# Patient Record
Sex: Male | Born: 1970 | Race: Black or African American | Hispanic: No | Marital: Married | Smoking: Never smoker
Health system: Southern US, Community
[De-identification: ages and names within clinical notes are randomized; demographics above are authoritative.]

## PROBLEM LIST (undated history)

## (undated) DIAGNOSIS — R7989 Other specified abnormal findings of blood chemistry: Secondary | ICD-10-CM

## (undated) DIAGNOSIS — Z9889 Other specified postprocedural states: Secondary | ICD-10-CM

## (undated) DIAGNOSIS — E7800 Pure hypercholesterolemia, unspecified: Secondary | ICD-10-CM

## (undated) DIAGNOSIS — G4700 Insomnia, unspecified: Secondary | ICD-10-CM

## (undated) DIAGNOSIS — F419 Anxiety disorder, unspecified: Secondary | ICD-10-CM

## (undated) DIAGNOSIS — E291 Testicular hypofunction: Secondary | ICD-10-CM

## (undated) DIAGNOSIS — G43909 Migraine, unspecified, not intractable, without status migrainosus: Secondary | ICD-10-CM

## (undated) DIAGNOSIS — M5020 Other cervical disc displacement, unspecified cervical region: Secondary | ICD-10-CM

## (undated) DIAGNOSIS — M5010 Cervical disc disorder with radiculopathy, unspecified cervical region: Secondary | ICD-10-CM

## (undated) DIAGNOSIS — R748 Abnormal levels of other serum enzymes: Secondary | ICD-10-CM

## (undated) DIAGNOSIS — I10 Essential (primary) hypertension: Secondary | ICD-10-CM

## (undated) DIAGNOSIS — Z8739 Personal history of other diseases of the musculoskeletal system and connective tissue: Secondary | ICD-10-CM

## (undated) HISTORY — DX: Other specified postprocedural states: Z98.890

## (undated) HISTORY — PX: SEPTOPLASTY: SUR1290

## (undated) HISTORY — DX: Other specified abnormal findings of blood chemistry: R79.89

## (undated) HISTORY — PX: BACK SURGERY: SHX140

## (undated) HISTORY — DX: Personal history of other diseases of the musculoskeletal system and connective tissue: Z87.39

## (undated) HISTORY — DX: Insomnia, unspecified: G47.00

## (undated) HISTORY — DX: Testicular hypofunction: E29.1

## (undated) HISTORY — DX: Other cervical disc displacement, unspecified cervical region: M50.20

## (undated) HISTORY — DX: Migraine, unspecified, not intractable, without status migrainosus: G43.909

## (undated) HISTORY — DX: Pure hypercholesterolemia, unspecified: E78.00

---

## 2000-12-11 ENCOUNTER — Emergency Department (HOSPITAL_COMMUNITY): Admission: EM | Admit: 2000-12-11 | Discharge: 2000-12-12 | Payer: Self-pay | Admitting: Emergency Medicine

## 2002-08-21 ENCOUNTER — Ambulatory Visit (HOSPITAL_COMMUNITY): Admission: RE | Admit: 2002-08-21 | Discharge: 2002-08-21 | Payer: Self-pay | Admitting: *Deleted

## 2002-08-21 ENCOUNTER — Encounter: Payer: Self-pay | Admitting: *Deleted

## 2002-08-29 ENCOUNTER — Encounter: Admission: RE | Admit: 2002-08-29 | Discharge: 2002-10-03 | Payer: Self-pay | Admitting: *Deleted

## 2002-11-22 ENCOUNTER — Ambulatory Visit (HOSPITAL_COMMUNITY): Admission: RE | Admit: 2002-11-22 | Discharge: 2002-11-22 | Payer: Self-pay | Admitting: *Deleted

## 2002-11-22 ENCOUNTER — Encounter: Payer: Self-pay | Admitting: *Deleted

## 2005-05-05 ENCOUNTER — Ambulatory Visit (HOSPITAL_COMMUNITY): Admission: RE | Admit: 2005-05-05 | Discharge: 2005-05-05 | Payer: Self-pay | Admitting: Family Medicine

## 2006-11-19 ENCOUNTER — Encounter
Admission: RE | Admit: 2006-11-19 | Discharge: 2006-11-19 | Payer: Self-pay | Admitting: Physical Medicine and Rehabilitation

## 2007-07-22 ENCOUNTER — Ambulatory Visit: Payer: Self-pay | Admitting: Pulmonary Disease

## 2007-08-12 ENCOUNTER — Ambulatory Visit: Payer: Self-pay | Admitting: Pulmonary Disease

## 2007-10-06 HISTORY — PX: LAMINECTOMY: SHX219

## 2007-10-06 HISTORY — PX: LUMBAR DISC SURGERY: SHX700

## 2007-12-19 ENCOUNTER — Ambulatory Visit (HOSPITAL_COMMUNITY): Admission: RE | Admit: 2007-12-19 | Discharge: 2007-12-20 | Payer: Self-pay | Admitting: Neurosurgery

## 2008-01-18 ENCOUNTER — Encounter: Admission: RE | Admit: 2008-01-18 | Discharge: 2008-01-18 | Payer: Self-pay | Admitting: Neurosurgery

## 2008-03-07 ENCOUNTER — Telehealth (INDEPENDENT_AMBULATORY_CARE_PROVIDER_SITE_OTHER): Payer: Self-pay | Admitting: *Deleted

## 2008-10-26 ENCOUNTER — Encounter: Admission: RE | Admit: 2008-10-26 | Discharge: 2008-10-26 | Payer: Self-pay | Admitting: Family Medicine

## 2011-02-17 NOTE — Assessment & Plan Note (Signed)
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  Since the last visit he has been working on various things  that we discussed and feels that his sleep is much improved.  He thinks  the trazodone has really helped in terms of getting him to sleep and he  is staying asleep through the night and feels more rested in the  mornings.  He did have to do the stimulus control therapy for about 3-4  days before he started getting into a normal sleep cycle.  The patient  has tried to stop the trazodone and does not sleep as well and has a  little bit more difficulties with sleep onset.  He has continued to do  the ritualistic behaviors in order to help him initiate sleep.   PHYSICAL EXAMINATION:  GENERAL:  He is a well-developed black male in no  distress.  Blood pressure is 126/82, pulse is 78, temperature is 98,  weight is 231 pounds, O2 saturation on room air is 99%.   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  The patient has been working on ritualistic behaviors at  night, his sleep hygiene, as well as modified stimulus control/sleep  restrictive therapy.  He has benefited greatly from trazodone.  I have  asked him to continue with this and to work on gradually weaning himself  off of the trazodone.  At least this medication does not have an  addictive effect or build tolerance.  I would like for him to try to  wean himself off the trazodone gradually over time.  He has clearly made  significant strides in just a very short period of time considering the  longstanding nature of his problem.  He is a reasonable person, and I  suspect that he will wean himself from this medication if he  sticks with  various things that we have discussed.   PLAN:  1. Continue with ritualistic behaviors, sleep hygiene issues, as well      as stimulus control therapy until he is able to sleep spontaneously      without trazodone.  I have asked him over the next few weeks to try      and decrease the dose to 25 mg nightly and then after a few more      weeks try and wean himself completely off.  Patient is agreeable to      this.  2. The patient will follow up on a p.r.n. basis.     Barbaraann Share, MD,FCCP  Electronically Signed    [facility]  DD: 08/12/2007  DT: 08/12/2007  Job #: [id number]   cc:   Tally Joe, M.D.

## 2011-02-17 NOTE — Assessment & Plan Note (Signed)
[facility]                             PULMONARY OFFICE NOTE   [name], [name]                      MRN:          [id number]  DATE:07/22/2007                            DOB:          1971/02/25    HISTORY OF PRESENT ILLNESS:  The patient is a 40 year old black male who  I have been asked to see for chronic insomnia.  Patient states that he  has difficulties over the last 10 years and that he is only getting  about 3-4 hours of sleep a night.  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  The patient typically tries to go to bed between  10 and 11 at night but unfortunately watches TV and reads in bed.  He  has the TV on a timer and the lights will typically go out at 12  midnight.  It takes him anywhere from 1-2 hours to get to sleep.  Patient will usually awaken at least 2-3 times a night and typically  2:30 is a very common time for him.  He will go downstairs and usually  get something to drink and will watch TV and may also eat as well.  He  will typically go back to sleep around 5:30 but can also easily fall  asleep on the sofa.  He starts his day at 7 a.m. and he is never rested  upon arising.  The patient does have a sense of frustration and going to  sleep is worse in hotels than at home.  He worries significantly about  whether he can function adequately at his demanding job the next day.  Patient does not nap during the day and drinks one cup of caffeinated  tea in the morning only, he does not take in any other caffeine.  He  will typically exercise between 5 and 6 pm for about 1 to 1-1/2 hours.  Patient has been tried on Ambien in the past and it resulted in abnormal  behaviors.  The patient has had a sleep study done on April 22, 2007  where he was found to have an apnea-hypopnea index of only 4 events per  hour which is not significantly  abnormal.   PAST MEDICAL HISTORY:  1. Dyslipidemia.  2. History of chronic headaches.   CURRENT MEDICATIONS:  1. Testim 1% daily.  2. Niaspan 500 mg daily.  3. Tylenol PM p.r.n.   PATIENT HAS NO KNOWN DRUG ALLERGIES.   SOCIAL HISTORY:  He is married and has children.  He has never smoked.   FAMILY HISTORY:  Remarkable for mother having high blood pressure and  diabetes.   REVIEW OF SYSTEMS:  As per history of present illness, also see patient  intake form documented in the chart.   PHYSICAL EXAMINATION:  GENERAL:  He is a well-developed male in no acute  distress.  Blood pressure 142/98, pulse 77, temperature 98, weight is  231 pounds, O2 saturation on room air 97%.  HEENT:  Pupils equal, round,  reactive to light and accommodation;  extraocular muscles are intact.  Nares are patent without discharge,  oropharynx is clear.  NECK:  Supple without JVD or lymphadenopathy, there is no palpable  thyromegaly.  CHEST:  Totally clear.  CARDIAC:  Reveals regular rate and rhythm with no murmurs, rubs or  gallops.  ABDOMEN:  Soft, nontender with good bowel sounds.  Genital exam, rectal exam and breast exam was not done and not  indicated.  LOWER EXTREMITIES:  Without edema, pulses are intact distally.  NEUROLOGICALLY:  Alert and oriented with no obvious motor deficits.   IMPRESSION:  Probable psychophysiologic insomnia.  The patient's history  is most consistent with this.  The patient has a very demanding job and  worries at night whether he can get enough sleep to function the next  day.  He clearly has a sense of frustration about going to sleep.  There  were also some issues with sleep hygiene that have developed since he  has been having his problem.   PLAN:  1. I have asked the patient to develop some type of ritualistic      behavior that he can do at bedtime that will enable him to turn      off his brain..  2. I have recommended stimulus control therapy and have gone  into      great detail with him about this.  This will include not staying in      bed if he can not get to sleep within 30 minutes and to come out to      the family room and watch TV or read under low lights until he can      get sleepy and go back to sleep.  I have asked him not to sleep on      the couch under any circumstances, and also not to eat or drink      anything during the middle of the night.  I have also asked him to      stay away from the computer after 11 p.m.  I have also described      for him sleep restriction therapy where he can go to bed much later      than usual so that his time of sleep more closely matches his time      in bed.  This helps with the ongoing frustration.  3. I have talked with the patient about sleep hygiene issues including      not watching any TV or reading in bed while he has this issue.  4. Will give the patient a trial of trazodone 50 mg nightly along with      Rozerem 8 mg nightly for the next 2 weeks.  He can then stay on the      trazodone until he can work through this further.  5. If the patient continues to have difficulties, he may need      cognitive behavioral therapy from psychiatry to help him get      through this.  6. The patient will follow up in 4 weeks, sooner if there is problems.     Barbaraann Share, MD,FCCP  Electronically Signed    [facility]  DD: 08/12/2007  DT: 08/12/2007  Job #: [id number]   cc:   Tally Joe, M.D.

## 2011-02-17 NOTE — Op Note (Signed)
[name], [name]               ACCOUNT NO.:  [id number]   MEDICAL RECORD NO.:  [id number]          PATIENT TYPE:  AMB   LOCATION:  SDS                          FACILITY:  [facility]   PHYSICIAN:  Donalee Citrin, M.D.        DATE OF BIRTH:  [birth year]   DATE OF PROCEDURE:  12/19/2007  DATE OF DISCHARGE:                               OPERATIVE REPORT   PREOPERATIVE DIAGNOSIS:  Right-sided L5-S1 radiculopathy from lumbar  spondylosis with stenosis L5-S1 right.   PROCEDURE:  Decompressive laminectomy L5-S4 and microdissection of the  right L5 and S1 nerve roots and microscopic diskectomy.   SURGEON:  Donalee Citrin, M.D.   ASSISTANT:  Reinaldo Meeker, M.D.   ANESTHESIA:  General endotracheal.   HISTORY OF PRESENT ILLNESS:  The patient is a very pleasant 40 year old  gentleman who has had back and predominantly right buttock and leg pain  radiating down to the posterior thigh and occasionally the top of his  foot and now is foot is bothering him with numbness and tingling in the  same distribution.  The patient was refractory to all forms of  conservative treatment.  MRI scan showed severe degenerative collapse  with spondylosis compressing the right L5 and S1 nerve root in the  lateral recess.  The patient because of failure of conservative  treatment, MRI findings, physical examination, the patient was  recommended laminectomy microdiskectomy and disk decompression with  foraminotomies of the L5 and S1 nerve roots.  The risks and benefits of  the operation were explained to the patient who understood and agreed to  proceed.   PROCEDURE IN DETAIL:  The patient was brought to the OR where he  received general anesthesia and placed prone on Wilson frame.  The back  was prepped in routine fashion.  Preoperative x-ray localized the L5-S1  disk space and after infiltrating with 10 mL of lidocaine with  epinephrine a midline incision was made.  Bovie electrocautery was used  to take down and  subperiosteal dissection carried out the lamina of L5-  S1 and the inferior aspect of L4.  Intraoperative x-ray confirmed  localization of the L5-S1 disk space then the entire L5 lamina on the  right side was removed and the undersurface of the medial facet complex  was under bitten.  The L5 pedicle was identified.  The S1 pedicle was  identified.  The superior aspect of the S1 lamina was removed.  This  exposed the thecal sac and ligamentum flavum which was also removed in  piecemeal fashion exposing the proximal S1 nerve root.  Then marching  laterally and superiorly the proximal L5 nerve root was identified flush  with the pedicle and this foraminotomy was under bitten.  There was  marked spondylosis and degenerative collapse and the L5 nerve root was  overlying a spondylitic ridge at the disk space which was bitten away  with an osteophyte remover after coagulation in the epidural veins.  Then further biting away of the lateral osteophyte at this level also  decompressed the proximal S1 nerve root.  Both foramina were opened  up  and unroofed and noted to be fairly stenotic due to the facet  arthropathy and spondylitic collapse.  By the end of the foraminotomies  each foramen easily accepted a hockeystick and coronary dilator and  noted to be widely patent.  The wound was then copiously irrigated and  meticulous hemostasis was maintained.  Gelfoam was laid on top of the  dura.  Muscle fascia was reapproximated in  layers with interrupted Vicryl and the skin was closed with a running 4-  0 subcuticular.  Benzoin and Steri-Strips applied.  The patient went to  recovery room in stable condition.  At the end of the case sponge and  needle counts were correct.           ______________________________  Donalee Citrin, M.D.     GC/MEDQ  D:  12/19/2007  T:  12/19/2007  Job:  [id number]

## 2011-06-29 LAB — CBC
HCT: 48.1
Hemoglobin: 16.1
MCHC: 33.5
RDW: 11.8

## 2011-10-03 ENCOUNTER — Ambulatory Visit (INDEPENDENT_AMBULATORY_CARE_PROVIDER_SITE_OTHER): Payer: 59

## 2011-10-03 DIAGNOSIS — J019 Acute sinusitis, unspecified: Secondary | ICD-10-CM

## 2012-07-06 ENCOUNTER — Other Ambulatory Visit: Payer: Self-pay | Admitting: Neurosurgery

## 2012-07-06 DIAGNOSIS — M545 Low back pain, unspecified: Secondary | ICD-10-CM

## 2012-08-21 ENCOUNTER — Encounter (HOSPITAL_COMMUNITY): Payer: Self-pay | Admitting: *Deleted

## 2012-08-21 ENCOUNTER — Emergency Department (HOSPITAL_COMMUNITY)
Admission: EM | Admit: 2012-08-21 | Discharge: 2012-08-22 | Disposition: A | Discharge status: Home / Self Care | Payer: 59 | Attending: Emergency Medicine | Admitting: Emergency Medicine

## 2012-08-21 DIAGNOSIS — E7800 Pure hypercholesterolemia, unspecified: Secondary | ICD-10-CM | POA: Insufficient documentation

## 2012-08-21 DIAGNOSIS — I10 Essential (primary) hypertension: Secondary | ICD-10-CM | POA: Insufficient documentation

## 2012-08-21 DIAGNOSIS — R002 Palpitations: Secondary | ICD-10-CM | POA: Insufficient documentation

## 2012-08-21 DIAGNOSIS — R21 Rash and other nonspecific skin eruption: Secondary | ICD-10-CM | POA: Insufficient documentation

## 2012-08-21 DIAGNOSIS — R55 Syncope and collapse: Secondary | ICD-10-CM | POA: Insufficient documentation

## 2012-08-21 DIAGNOSIS — Z79899 Other long term (current) drug therapy: Secondary | ICD-10-CM | POA: Insufficient documentation

## 2012-08-21 HISTORY — DX: Pure hypercholesterolemia, unspecified: E78.00

## 2012-08-21 HISTORY — DX: Essential (primary) hypertension: I10

## 2012-08-21 LAB — BASIC METABOLIC PANEL
BUN: 12 mg/dL (ref 6–23)
Calcium: 9.3 mg/dL (ref 8.4–10.5)
Creatinine, Ser: 1.33 mg/dL (ref 0.50–1.35)
GFR calc non Af Amer: 64 mL/min — ABNORMAL LOW (ref >90)
Glucose, Bld: 129 mg/dL — ABNORMAL HIGH (ref 70–99)
Sodium: 137 mEq/L (ref 135–145)

## 2012-08-21 LAB — URINALYSIS, ROUTINE W REFLEX MICROSCOPIC
Bilirubin Urine: NEGATIVE
Glucose, UA: 100 mg/dL — AB
Hgb urine dipstick: NEGATIVE
Ketones, ur: NEGATIVE mg/dL
Protein, ur: NEGATIVE mg/dL

## 2012-08-21 LAB — CBC
Hemoglobin: 16.3 g/dL (ref 13.0–17.0)
MCH: 30.1 pg (ref 26.0–34.0)
MCHC: 33.3 g/dL (ref 30.0–36.0)
MCV: 90.2 fL (ref 78.0–100.0)

## 2012-08-21 MED ORDER — SODIUM CHLORIDE 0.9 % IV BOLUS (SEPSIS)
1000.0000 mL | Freq: Once | INTRAVENOUS | Status: AC
  Administered 2012-08-21: 1000 mL via INTRAVENOUS

## 2012-08-21 MED ORDER — POTASSIUM CHLORIDE CRYS ER 20 MEQ PO TBCR
40.0000 meq | EXTENDED_RELEASE_TABLET | Freq: Once | ORAL | Status: AC
  Administered 2012-08-22: 40 meq via ORAL
  Filled 2012-08-21: qty 2

## 2012-08-21 NOTE — ED Provider Notes (Signed)
History     CSN: [id number]  Arrival date & time 08/21/12  2203   First MD Initiated Contact with Patient 08/21/12 2256      Chief Complaint  Patient presents with  . Dizziness  . Near Syncope    (Consider location/radiation/quality/duration/timing/severity/associated sxs/prior treatment) HPI Comments: Pateint has 3 episodes of dizziness and rapid heart rate lasting about 10 minutes each  Has had this in the past but with exercise at the gym.  Denies caffeine consumption, new medications, drug use, recent illness    The history is provided by the patient.    Past Medical History  Diagnosis Date  . Hypercholesteremia   . Hypertension     Past Surgical History  Procedure Date  . Back surgery     diskectomy    History reviewed. No pertinent family history.  History  Substance Use Topics  . Smoking status: Not on file  . Smokeless tobacco: Not on file  . Alcohol Use: Yes     Comment: occ      Review of Systems  Constitutional: Negative for fever and chills.  HENT: Negative for congestion and rhinorrhea.   Eyes: Negative for visual disturbance.  Respiratory: Negative for shortness of breath.   Cardiovascular: Positive for palpitations. Negative for chest pain.  Gastrointestinal: Negative for nausea, vomiting and diarrhea.  Genitourinary: Negative for dysuria and frequency.  Musculoskeletal: Negative for myalgias.  Skin: Negative for rash and wound.  Neurological: Positive for light-headedness. Negative for weakness and numbness.    Allergies  Review of patient's allergies indicates no known allergies.  Home Medications   Current Outpatient Rx  Name  Route  Sig  Dispense  Refill  . LOSARTAN POTASSIUM 50 MG PO TABS   Oral   Take 50 mg by mouth every morning.         Marland Kitchen MELOXICAM 7.5 MG PO TABS   Oral   Take 7.5 mg by mouth daily as needed. For pain         . QUETIAPINE FUMARATE 100 MG PO TABS   Oral   Take 100 mg by mouth at bedtime.         Marland Kitchen ROSUVASTATIN CALCIUM 10 MG PO TABS   Oral   Take 10 mg by mouth every other day.         . TESTOSTERONE CYPIONATE 100 MG/ML IM OIL   Intramuscular   Inject 100 mg into the muscle every 14 (fourteen) days. For IM use only           BP 152/97  Pulse 94  Temp 98.1 F (36.7 C) (Oral)  Resp 20  Ht 5\' 9"  (1.753 m)  Wt 225 lb (102.059 kg)  BMI 33.23 kg/m2  SpO2 96%  Physical Exam  Constitutional: He appears well-developed and well-nourished.  HENT:  Head: Normocephalic.  Eyes: Pupils are equal, round, and reactive to light.  Neck: Normal range of motion.  Cardiovascular: Regular rhythm.  Tachycardia present.   Pulmonary/Chest: Effort normal and breath sounds normal. He exhibits no tenderness.  Abdominal: He exhibits no distension. There is no tenderness.  Musculoskeletal: Normal range of motion. He exhibits no edema and no tenderness.  Skin: No rash noted. No erythema.    ED Course  Procedures (including critical care time)  Labs Reviewed  BASIC METABOLIC PANEL - Abnormal; Notable for the following:    Potassium 3.3 (*)     Glucose, Bld 129 (*)     GFR calc non Af Amer 64 (*)  GFR calc Af Amer 74 (*)     All other components within normal limits  URINALYSIS, ROUTINE W REFLEX MICROSCOPIC - Abnormal; Notable for the following:    Color, Urine STRAW (*)     Glucose, UA 100 (*)     All other components within normal limits  GLUCOSE, CAPILLARY - Abnormal; Notable for the following:    Glucose-Capillary 135 (*)     All other components within normal limits  CBC  TROPONIN I   No results found.   1. Palpitations     ED ECG REPORT   Date: 08/22/2012  EKG Time: 1:19 AM  Rate:100  Rhythm: sinus tachycardia,  there are no previous tracings available for comparison  Axis: normal  Intervals:none  ST&T Change: none  Narrative Interpretation: ST otherwise normal             MDM   No abnormalities found tonight patient encouraged to keep a diary of  symptoms looking for a pattern to emerge       Arman Filter, NP 08/22/12 0119  Arman Filter, NP 08/22/12 [phone]

## 2012-08-21 NOTE — ED Notes (Addendum)
EKG given to EDP, Rancour, MD.

## 2012-08-21 NOTE — ED Notes (Signed)
Pt reports sudden onset of dizziness, rapid heart beat and feeling faint while at home. Reports taking night time meds at 830pm-100mg  of seroquel. States at 900pm symptoms started. Pt reports "I work out a lot and this has happened twice in the past month while working out. States I just put my head between my legs and it gets better."

## 2012-08-22 NOTE — ED Provider Notes (Signed)
Medical screening examination/treatment/procedure(s) were performed by non-physician practitioner and as supervising physician I was immediately available for consultation/collaboration.   David H Yao, MD 08/22/12 0429 

## 2012-10-20 ENCOUNTER — Other Ambulatory Visit: Payer: Self-pay | Admitting: Otolaryngology

## 2012-10-20 DIAGNOSIS — R0981 Nasal congestion: Secondary | ICD-10-CM

## 2012-10-20 DIAGNOSIS — J329 Chronic sinusitis, unspecified: Secondary | ICD-10-CM

## 2012-11-07 ENCOUNTER — Ambulatory Visit
Admission: RE | Admit: 2012-11-07 | Discharge: 2012-11-07 | Disposition: A | Discharge status: Home / Self Care | Payer: 59 | Source: Ambulatory Visit | Attending: Otolaryngology | Admitting: Otolaryngology

## 2012-11-07 DIAGNOSIS — R0981 Nasal congestion: Secondary | ICD-10-CM

## 2012-11-07 DIAGNOSIS — J329 Chronic sinusitis, unspecified: Secondary | ICD-10-CM

## 2013-01-13 ENCOUNTER — Ambulatory Visit (INDEPENDENT_AMBULATORY_CARE_PROVIDER_SITE_OTHER): Payer: 59 | Admitting: Neurology

## 2013-01-13 ENCOUNTER — Ambulatory Visit: Payer: Self-pay | Admitting: Neurology

## 2013-01-13 ENCOUNTER — Encounter: Payer: Self-pay | Admitting: Neurology

## 2013-01-13 VITALS — BP 135/91 | HR 53 | Temp 98.1°F | Ht 70.0 in | Wt 236.0 lb

## 2013-01-13 DIAGNOSIS — F410 Panic disorder [episodic paroxysmal anxiety] without agoraphobia: Secondary | ICD-10-CM | POA: Insufficient documentation

## 2013-01-13 DIAGNOSIS — G4700 Insomnia, unspecified: Secondary | ICD-10-CM | POA: Insufficient documentation

## 2013-01-13 MED ORDER — QUETIAPINE FUMARATE 100 MG PO TABS: 100.0000 mg | ORAL_TABLET | Freq: Every day | ORAL | Status: DC

## 2013-01-13 NOTE — Patient Instructions (Signed)
Insomnia  Insomnia means you have trouble falling or staying asleep. It affects about one person in three at different times and is usually related to stress from work, school, or personal relations. Insomnia is also a sign of depression or anxiety. Other medical problems that cause insomnia include conditions that cause pain, night leg cramps, coughing, shortness of breath, urinary problems, and fevers. Sleep apnea is an abnormal breathing pattern at night that can cause insomnia and loud snoring. Certain medications and excess intake of caffeine drinks (coffee, tea, colas) can also interfere with normal sleep.  Treatment for insomnia depends on the cause. Besides specific medical treatment, the following measures can help you relax and get better sleep. Get regular exercise every day, at least several hours before bed time. Try to get to bed at the same time every night. Take a hot bath before retiring to help you relax. Do not stay in bed if you are unable to sleep. During the daytime avoid staying in bed to watch television, eat, or read. Reduce unwanted noise and light in your room. Keep your room at a comfortable temperature.  Avoid alcohol as it causes one to sleep less soundly, may cause you to awaken during the night, and can leave you feeling groggy the next day. Using a mild sedative prescribed or suggested by your caregiver may be needed, but the daily use of sleeping pills is not recommended. Anti-depressant medicines can improve sleep in people with depression. Please call your doctor for follow up care to better understand the cause and proper treatment of your insomnia.  Document Released: 10/29/2004 Document Revised: 12/14/2011 Document Reviewed: 09/21/2005  [employer] Patient Information 2013 [employer]

## 2013-01-13 NOTE — Progress Notes (Signed)
[facility]  Provider:  Dr Vickey Huger Referring Provider: No ref. provider found Primary Care Physician:  Nathan Sauers, RN  Chief Complaint  Patient presents with  . Follow-up    insomnia    HPI:  [name] is a 42 y.o. male here originally  as a referral from [employer] [facility].  The patient suffers from chronic insomnia and had been followed by Dr. Theressa Love before. He had tried Ambien, clonazepam, Effexor were with old achieving more sleep time or a shortened sleep latency. The patient was hypersomnolent in daytime and very fatigued before he was treated for low testosterone levels, since then he has become more insomniac but is less daytime  . The patient has a regular work schedule between 8 and 5 PM and has to drive a lot.  He has continued trouble initiating and maintaining sleep often he wakes up earlier around 1 or 2 AM and that she is on before hours of sleep in total. [name]  at [facility] tried sleep restriction and prescribed Ambien and trazodone,  which worked for several weeks.  He then tried Klonopin and this only worked for  3-4 nights it also gave him a hangover.  The patient started in spring 2013 on Seroquel  And he initially was happy to report that he fell asleep and sustain sleep for 6-7 hours, while  in the daytime he felt refreshed and energized.  He reported no cognitive impairment and his Epworth was at 2 points his fatigue severity scale at 12 points at that time he required however at increasingly higher doses and 150 mg of Seroquel . By  July of 2013 he developed again fragmented sleep combine to Seroquel with Lunesta.  Epworth went up to 15 points and severe fatigue severity scale to a 42 points.  In November of last year he again became mainly and some neck he also had severe migraines and at least 2 panic attacks within the months of November he was evaluated in a local emergency room with a racing heart but had  normal EKG and enzymes him at that time we discussed a referral to a psychologist for insomnia and for possible PTE as the components or panic disorder components. Today's a revisit and I plan to refill her sleep aids but also wanted to followup on his referral.  The patient had just undergone a septoplasty and the latest visit with a psychologist for a month until he is healed. Panic attacks now occur 3-4 of days out of the week , often while relaxed - heart starts pounding and he becomes short of breath.   Review of Systems: Out of a complete 14 system review, the patient complains of only the following symptoms, and all other reviewed systems are negative. EDS. fatigue .  History   Social History  . Marital Status: Married    Spouse Name: N/A    Number of Children: N/A  . Years of Education: N/A   Occupational History  . [employer]     has an associate degree   Social History Main Topics  . Smoking status: Never Smoker   . Smokeless tobacco: Not on file  . Alcohol Use: Yes     Comment: occasionally, one glass of wine monthly  . Drug Use: No  . Sexually Active: Not on file   Other Topics Concern  . Not on file   Social History Narrative  . No narrative on file  Family History  Problem Relation Age of Onset  . Diabetes Mother   . Hypertension Mother   . Stroke Mother   . Diabetes Father   . Migraines Sister     3 sisters    Past Medical History  Diagnosis Date  . Hypercholesteremia   . Hypertension   . Insomnia     PTSD  . H/O degenerative disc disease   . Hypogonadism, male   . Herniated disc, cervical     L4,L5  . H/O laminectomy     lumbar, 2009  . Low testosterone   . Migraine     once monthly  . High cholesterol     Past Surgical History  Procedure Laterality Date  . Back surgery      diskectomy  . Laminectomy  2009  . Lumbar disc surgery  2009    Current Outpatient Prescriptions  Medication Sig Dispense Refill  . Eszopiclone  (ESZOPICLONE) 3 MG TABS Take 3 mg by mouth at bedtime. Take immediately before bedtime      . QUEtiapine (SEROQUEL) 100 MG tablet Take 100 mg by mouth at bedtime.      . rosuvastatin (CRESTOR) 10 MG tablet Take 5 mg by mouth daily.       Marland Kitchen zolpidem (AMBIEN) 10 MG tablet Take 10 mg by mouth at bedtime as needed for sleep. Patient alternated with Lunesta po at night.      Marland Kitchen amLODipine (NORVASC) 10 MG tablet 10 mg daily.      Marland Kitchen testosterone cypionate (DEPOTESTOTERONE CYPIONATE) 100 MG/ML injection Inject 100 mg into the muscle every 14 (fourteen) days. For IM use only       No current facility-administered medications for this visit.    Allergies as of 01/13/2013 - Review Complete 01/13/2013  Allergen Reaction Noted  . Losartan Palpitations 01/13/2013    Vitals: BP 135/91  Pulse 53  Temp(Src) 98.1 F (36.7 C) (Oral)  Ht 5\' 10"  (1.778 m)  Wt 236 lb (107.049 kg)  BMI 33.86 kg/m2 Last Weight:  Wt Readings from Last 1 Encounters:  01/13/13 236 lb (107.049 kg)   Last Height:   Ht Readings from Last 1 Encounters:  01/13/13 5\' 10"  (1.778 m)      Physical exam:  General: The patient is awake, alert and appears not in acute distress. The patient is well groomed. Head: Normocephalic, atraumatic. Neck is supple. Mallampati 3 , neck circumference:17.5 inches . Cardiovascular:  Regular rate and rhythm, without  murmurs or carotid bruit, and without distended neck veins. Respiratory: Lungs are clear to auscultation. Skin:  Without evidence of edema, or rash Trunk: BMI is  elevated but the patient is very muscular . Neurologic exam : The patient is awake and alert, oriented to place and time.  Memory subjective  described as intact. There is a normal attention span & concentration ability. Speech is fluent without  dysarthria, dysphonia or aphasia. Mood and affect are appropriate.  Cranial nerves: Pupils are equal and briskly reactive to light. Funduscopic exam without  evidence of pallor  or edema. Extraocular movements  in vertical and horizontal planes intact and without nystagmus. Visual fields by finger perimetry are intact. Hearing to finger rub intact.  Facial sensation intact to fine touch. Facial motor strength is symmetric and tongue and uvula move midline.  Motor exam:   Normal tone and normal muscle bulk and symmetric normal strength in all extremities.  Sensory:  Fine touch, pinprick and vibration were tested in all extremities. Proprioception  is tested in the upper extremities only. This was  normal.  Coordination: Rapid alternating movements in the fingers/hands is tested and normal. Finger-to-nose maneuver tested and normal without evidence of ataxia, dysmetria or tremor.  Gait and station: Patient walks without assistive device and is able and assisted stool climb up to the exam table. Strength within normal limits. Stance is stable and normal. Romberg testing is normal.  Deep tendon reflexes: in the  upper and lower extremities are symmetric and intact.  Assessment:  After physical and neurologic examination, review of laboratory studies, imaging, neurophysiology testing and pre-existing records, assessment will be reviewed on the problem list.  Chronic insomnia, rebound insomnia, panic attacks.    Plan:  Treatment plan and additional workup will be reviewed under Problem List.

## 2013-02-09 ENCOUNTER — Other Ambulatory Visit: Payer: Self-pay | Admitting: Family Medicine

## 2013-02-09 DIAGNOSIS — R1032 Left lower quadrant pain: Secondary | ICD-10-CM

## 2013-02-10 ENCOUNTER — Ambulatory Visit
Admission: RE | Admit: 2013-02-10 | Discharge: 2013-02-10 | Disposition: A | Discharge status: Home / Self Care | Payer: 59 | Source: Ambulatory Visit | Attending: Family Medicine | Admitting: Family Medicine

## 2013-02-10 DIAGNOSIS — R1032 Left lower quadrant pain: Secondary | ICD-10-CM

## 2013-02-10 MED ORDER — IOHEXOL 300 MG/ML  SOLN
125.0000 mL | Freq: Once | INTRAMUSCULAR | Status: AC | PRN
  Administered 2013-02-10: 125 mL via INTRAVENOUS

## 2013-06-16 ENCOUNTER — Other Ambulatory Visit: Payer: Self-pay | Admitting: Neurology

## 2013-06-18 ENCOUNTER — Other Ambulatory Visit: Payer: Self-pay | Admitting: Neurology

## 2013-06-20 ENCOUNTER — Other Ambulatory Visit: Payer: Self-pay

## 2013-06-20 MED ORDER — ZOLPIDEM TARTRATE 10 MG PO TABS: 10.0000 mg | ORAL_TABLET | Freq: Every evening | ORAL | Status: DC | PRN

## 2013-06-21 NOTE — Telephone Encounter (Signed)
Rx signed and faxed.

## 2013-06-24 ENCOUNTER — Other Ambulatory Visit: Payer: Self-pay | Admitting: Neurology

## 2013-07-13 ENCOUNTER — Encounter: Payer: Self-pay | Admitting: Neurology

## 2013-07-13 ENCOUNTER — Ambulatory Visit (INDEPENDENT_AMBULATORY_CARE_PROVIDER_SITE_OTHER): Payer: 59 | Admitting: Neurology

## 2013-07-13 VITALS — BP 150/89 | HR 86 | Resp 17 | Ht 70.0 in | Wt 237.0 lb

## 2013-07-13 DIAGNOSIS — G4700 Insomnia, unspecified: Secondary | ICD-10-CM

## 2013-07-13 MED ORDER — QUETIAPINE FUMARATE 100 MG PO TABS: 100.0000 mg | ORAL_TABLET | Freq: Every day | ORAL | Status: DC

## 2013-07-13 MED ORDER — ZOLPIDEM TARTRATE 10 MG PO TABS: 10.0000 mg | ORAL_TABLET | Freq: Every evening | ORAL | Status: DC | PRN

## 2013-07-13 NOTE — Patient Instructions (Signed)
Insomnia Insomnia means you have trouble falling or staying asleep. It affects about one person in three at different times and is usually related to stress from work, school, or personal relations. Insomnia is also a sign of depression or anxiety. Other medical problems that cause insomnia include conditions that cause pain, night leg cramps, coughing, shortness of breath, urinary problems, and fevers. Sleep apnea is an abnormal breathing pattern at night that can cause insomnia and loud snoring. Certain medications and excess intake of caffeine drinks (coffee, tea, colas) can also interfere with normal sleep. Treatment for insomnia depends on the cause. Besides specific medical treatment, the following measures can help you relax and get better sleep. Get regular exercise every day, at least several hours before bed time. Try to get to bed at the same time every night. Take a hot bath before retiring to help you relax. Do not stay in bed if you are unable to sleep. During the daytime avoid staying in bed to watch television, eat, or read. Reduce unwanted noise and light in your room. Keep your room at a comfortable temperature. Avoid alcohol as it causes one to sleep less soundly, may cause you to awaken during the night, and can leave you feeling groggy the next day. Using a mild sedative prescribed or suggested by your caregiver may be needed, but the daily use of sleeping pills is not recommended. Anti-depressant medicines can improve sleep in people with depression. Please call your doctor for follow up care to better understand the cause and proper treatment of your insomnia. Document Released: 10/29/2004 Document Revised: 12/14/2011 Document Reviewed: 09/21/2005 ExitCare Patient Information 2014 [employer].  

## 2013-07-13 NOTE — Progress Notes (Signed)
[facility]  Provider:  Melvyn Novas, M D  Referring Provider: No ref. provider found Primary Care Physician:  Leota Sauers, RN  Chief Complaint  Patient presents with  . Insomnia    F/U Visit    HPI:  [name] is a 42 y.o. male  Is seen here as a  revisit  from Dr. [name].  He has continued trouble initiating and maintaining sleep often he wakes up earlier around 1 or 2 AM and that she is on before hours of sleep in total.  [name] at [facility] tried sleep restriction and prescribed Ambien and trazodone, which worked for several weeks. He then tried Klonopin and this only worked for 3-4 nights- it also gave him a hangover.  The patient started in spring 2013 on Seroquel And he initially was happy to report that he fell asleep and sustain sleep for 6-7 hours, while in the daytime he felt refreshed and energized.  He reported no cognitive impairment and his Epworth was at 2 points his fatigue severity scale at 12 points at that time he required however at increasingly higher doses and 150 mg of Seroquel .  By July of 2013 he developed again fragmented sleep combine to Seroquel with Lunesta.  Epworth went up to 15 points and severe fatigue severity scale to a 42 points.  In November of last year he again became mainly and some neck he also had severe migraines and at least 2 panic attacks within the months of November.  He was evaluated in a local emergency room with a racing heart but had normal EKG and enzymes. We discussed a referral to a psychologist for insomnia and for possible PTSD as the components or panic disorder components.   Today's a revisit and I plan to refill her sleep aids but also wanted to followup on his referral.  The patient had just undergone a septoplasty and the latest visit with a psychologist for a month until he is healed. Panic attacks now occur 3-4 of days out of the week , often while relaxed - heart starts  pounding and he becomes short of breath.  The patient's work schedule and remained very irregular and he is out of town 3 sometimes 4 nights a week.  Overall he feels that he gets a maximum of 5 hours of sleep now, often interrupted by at least 2 periods of wakefulness. But it is an improvement in comparison to last November at state of affairs. Diagnosis remains unchanged as chronic insomnia, his fatigue severity score is 27 points, Epworth sleepiness score 11 points.      History   Social History  . Marital Status: Married    Spouse Name: N/A    Number of Children: N/A  . Years of Education: N/A   Occupational History  . [employer]     has an associate degree   Social History Main Topics  . Smoking status: Never Smoker   . Smokeless tobacco: Not on file  . Alcohol Use: Yes     Comment: occasionally, one glass of wine monthly  . Drug Use: No  . Sexual Activity: Not on file   Other Topics Concern  . Not on file   Social History Narrative  . No narrative on file    Family History  Problem Relation Age of Onset  . Diabetes Mother   . Hypertension Mother   . Stroke Mother   . Diabetes Father   .  Migraines Sister     3 sisters    Past Medical History  Diagnosis Date  . Hypercholesteremia   . Hypertension   . Insomnia     PTSD  . H/O degenerative disc disease   . Hypogonadism, male   . Herniated disc, cervical     L4,L5  . H/O laminectomy     lumbar, 2009  . Low testosterone   . Migraine     once monthly  . High cholesterol     Past Surgical History  Procedure Laterality Date  . Back surgery      diskectomy  . Laminectomy  2009  . Lumbar disc surgery  2009    Current Outpatient Prescriptions  Medication Sig Dispense Refill  . amLODipine (NORVASC) 10 MG tablet 10 mg daily.      . QUEtiapine (SEROQUEL) 100 MG tablet TAKE 1 TABLET BY MOUTH AT BEDTIME  30 tablet  3  . rosuvastatin (CRESTOR) 10 MG tablet Take 5 mg by mouth daily.       Marland Kitchen  testosterone cypionate (DEPOTESTOTERONE CYPIONATE) 100 MG/ML injection Inject 100 mg into the muscle every 14 (fourteen) days. For IM use only      . zolpidem (AMBIEN) 10 MG tablet Take 1 tablet (10 mg total) by mouth at bedtime as needed for sleep. Patient alternated with Lunesta po at night.  30 tablet  3  . hydrOXYzine (ATARAX/VISTARIL) 10 MG tablet       . meloxicam (MOBIC) 15 MG tablet        No current facility-administered medications for this visit.    Allergies as of 07/13/2013 - Review Complete 07/13/2013  Allergen Reaction Noted  . Losartan Palpitations 01/13/2013    Vitals: BP 150/89  Pulse 86  Resp 17  Ht 5\' 10"  (1.778 m)  Wt 237 lb (107.502 kg)  BMI 34.01 kg/m2 Last Weight:  Wt Readings from Last 1 Encounters:  07/13/13 237 lb (107.502 kg)   Last Height:   Ht Readings from Last 1 Encounters:  07/13/13 5\' 10"  (1.778 m)    Physical exam:  General: The patient is awake, alert and appears not in acute distress. The patient is well groomed. Head: Normocephalic, atraumatic. Neck is supple. Mallampati 3 , neck circumference: 17. Cardiovascular:  Regular rate and rhythm , without  murmurs or carotid bruit, and without distended neck veins. Respiratory: Lungs are clear to auscultation. Skin:  Without evidence of edema, or rash Trunk: BMI is  elevated and patient  Is muscular. Neurologic exam : The patient is awake and alert, oriented to place and time.  Memory subjective described as intact. There is a normal attention span & concentration ability.  Speech is fluent without dysarthria, dysphonia or aphasia. Mood and affect are appropriate.  Cranial nerves: Pupils are equal and briskly reactive to light. Funduscopic exam without  evidence of pallor or edema. Extraocular movements  in vertical and horizontal planes intact and without nystagmus. Visual fields by finger perimetry are intact. Hearing to finger rub intact.  Facial sensation intact to fine touch. Facial motor  strength is symmetric and tongue and uvula move midline.  Motor exam:    Normal tone and normal muscle bulk and symmetric normal strength in all extremities.  Sensory:  Fine touch, pinprick and vibration were tested in all extremities. Proprioception is  normal.  Coordination: Rapid alternating movements in the fingers/hands is tested and normal. Finger-to-nose maneuver tested and normal without evidence of ataxia, dysmetria or tremor.  Gait and station:  Patient walks without assistive device and is able and assisted stool climb up to the exam table. Strength within normal limits. Stance is stable and normal. Tandem unfragmented. Romberg testing is normal.  Deep tendon reflexes: in the  upper and lower extremities are symmetric and intact. Babinski maneuver  downgoing.   Assessment:  After physical and neurologic examination, review of laboratory studies, imaging, neurophysiology testing and pre-existing records, assessment is  Chronic insomnia, Call dr Nolen Mu for  Insomnia evaluation.   Plan:  Refill .

## 2013-07-30 ENCOUNTER — Encounter (HOSPITAL_BASED_OUTPATIENT_CLINIC_OR_DEPARTMENT_OTHER): Payer: Self-pay | Admitting: Emergency Medicine

## 2013-07-30 ENCOUNTER — Other Ambulatory Visit (HOSPITAL_BASED_OUTPATIENT_CLINIC_OR_DEPARTMENT_OTHER): Payer: 59

## 2013-07-30 ENCOUNTER — Emergency Department (HOSPITAL_BASED_OUTPATIENT_CLINIC_OR_DEPARTMENT_OTHER): Payer: 59

## 2013-07-30 ENCOUNTER — Emergency Department (HOSPITAL_BASED_OUTPATIENT_CLINIC_OR_DEPARTMENT_OTHER)
Admission: EM | Admit: 2013-07-30 | Discharge: 2013-07-30 | Disposition: A | Discharge status: Home / Self Care | Payer: 59 | Attending: Emergency Medicine | Admitting: Emergency Medicine

## 2013-07-30 DIAGNOSIS — Z79899 Other long term (current) drug therapy: Secondary | ICD-10-CM | POA: Insufficient documentation

## 2013-07-30 DIAGNOSIS — M25552 Pain in left hip: Secondary | ICD-10-CM

## 2013-07-30 DIAGNOSIS — M25559 Pain in unspecified hip: Secondary | ICD-10-CM | POA: Insufficient documentation

## 2013-07-30 DIAGNOSIS — M779 Enthesopathy, unspecified: Secondary | ICD-10-CM

## 2013-07-30 DIAGNOSIS — G4700 Insomnia, unspecified: Secondary | ICD-10-CM | POA: Insufficient documentation

## 2013-07-30 DIAGNOSIS — I10 Essential (primary) hypertension: Secondary | ICD-10-CM | POA: Insufficient documentation

## 2013-07-30 DIAGNOSIS — E7800 Pure hypercholesterolemia, unspecified: Secondary | ICD-10-CM | POA: Insufficient documentation

## 2013-07-30 DIAGNOSIS — M898X9 Other specified disorders of bone, unspecified site: Secondary | ICD-10-CM | POA: Insufficient documentation

## 2013-07-30 DIAGNOSIS — E291 Testicular hypofunction: Secondary | ICD-10-CM | POA: Insufficient documentation

## 2013-07-30 MED ORDER — HYDROCODONE-ACETAMINOPHEN 5-325 MG PO TABS: 1.0000 | ORAL_TABLET | ORAL | Status: DC | PRN

## 2013-07-30 MED ORDER — HYDROCODONE-ACETAMINOPHEN 5-325 MG PO TABS
2.0000 | ORAL_TABLET | Freq: Once | ORAL | Status: AC
  Administered 2013-07-30: 2 via ORAL
  Filled 2013-07-30: qty 2

## 2013-07-30 NOTE — ED Notes (Signed)
Left flank pain radiating left groin x 1 week

## 2013-07-30 NOTE — ED Provider Notes (Signed)
CSN: [id number]     Arrival date & time 07/30/13  1713 History   First MD Initiated Contact with Patient 07/30/13 1734     Chief Complaint  Patient presents with  . Flank Pain   (Consider location/radiation/quality/duration/timing/severity/associated sxs/prior Treatment) HPI Comments: Patient is a 42 year old male who presents to the emergency department complaining of left hip pain radiating around to the front of his hip times one week. Patient states the pain initially was not that bad and has increased in intensity over the past few days. Pain currently 8/10. He was evaluated at the urgent care Center 2 days ago and given an anti-inflammatory and muscle relaxer without relief. Pain worse with walking or going from a seated to standing position. Denies back pain, however does have a history of laminectomy and diskectomy at L4-L5 in 2009. He cannot recall any injury or trauma. He does exercise 6 days a week, states he has not done anything out of is normal.   Patient is a 42 y.o. male presenting with flank pain. The history is provided by the patient.  Flank Pain    Past Medical History  Diagnosis Date  . Hypercholesteremia   . Hypertension   . Insomnia     PTSD  . H/O degenerative disc disease   . Hypogonadism, male   . Herniated disc, cervical     L4,L5  . H/O laminectomy     lumbar, 2009  . Low testosterone   . Migraine     once monthly  . High cholesterol    Past Surgical History  Procedure Laterality Date  . Back surgery      diskectomy  . Laminectomy  2009  . Lumbar disc surgery  2009  . Septoplasty     Family History  Problem Relation Age of Onset  . Diabetes Mother   . Hypertension Mother   . Stroke Mother   . Diabetes Father   . Migraines Sister     3 sisters   History  Substance Use Topics  . Smoking status: Never Smoker   . Smokeless tobacco: Not on file  . Alcohol Use: Yes     Comment: occasionally, one glass of wine monthly    Review of Systems   Musculoskeletal:       Positive for left hip pain.  All other systems reviewed and are negative.    Allergies  Losartan  Home Medications   Current Outpatient Rx  Name  Route  Sig  Dispense  Refill  . amLODipine (NORVASC) 10 MG tablet      10 mg daily.         . meloxicam (MOBIC) 15 MG tablet               . QUEtiapine (SEROQUEL) 100 MG tablet   Oral   Take 1 tablet (100 mg total) by mouth at bedtime.   30 tablet   5   . rosuvastatin (CRESTOR) 10 MG tablet   Oral   Take 5 mg by mouth daily.          Marland Kitchen testosterone cypionate (DEPOTESTOTERONE CYPIONATE) 100 MG/ML injection   Intramuscular   Inject 100 mg into the muscle every 14 (fourteen) days. For IM use only         . zolpidem (AMBIEN) 10 MG tablet   Oral   Take 1 tablet (10 mg total) by mouth at bedtime as needed for sleep. Patient alternated with Lunesta po at night.   30 tablet  3     Pharmacy Fax [phone]   . hydrOXYzine (ATARAX/VISTARIL) 10 MG tablet                BP 132/88  Pulse 74  Resp 18  Ht 5\' 9"  (1.753 m)  Wt 235 lb (106.595 kg)  BMI 34.69 kg/m2  SpO2 100% Physical Exam  Nursing note and vitals reviewed. Constitutional: He is oriented to person, place, and time. He appears well-developed and well-nourished. No distress.  HENT:  Head: Normocephalic and atraumatic.  Mouth/Throat: Oropharynx is clear and moist.  Eyes: Conjunctivae are normal.  Neck: Normal range of motion. Neck supple.  Cardiovascular: Normal rate, regular rhythm and normal heart sounds.   Pulmonary/Chest: Effort normal and breath sounds normal.  Abdominal: Soft. Bowel sounds are normal. There is no tenderness.  Musculoskeletal: He exhibits no edema.  TTP throughout left hip. Full ROM, pain noted with flexion, internal and external rotation. No deformity, erythema, edema, warmth. Limping gate. No tenderness of lumbar spine.  Neurological: He is alert and oriented to person, place, and time. He has normal  strength. No sensory deficit.  Strength LE 5/5 and equal bilateral.  Skin: Skin is warm and dry. He is not diaphoretic.  Psychiatric: He has a normal mood and affect. His behavior is normal.    ED Course  Procedures (including critical care time) Labs Review Labs Reviewed - No data to display Imaging Review Dg Lumbar Spine Complete  07/30/2013   CLINICAL DATA:  Hip pain  EXAM: LUMBAR SPINE - COMPLETE 4+ VIEW  COMPARISON:  CT 02/10/2013  FINDINGS: Interbody fusion L5-S1. Normal alignment. Negative for fracture. No significant osseous degenerative change. Normal mineralization.  IMPRESSION: No acute abnormality   Electronically Signed   By: Oley Balm M.D.   On: 07/30/2013 18:33   Dg Hip Complete Left  07/30/2013   CLINICAL DATA:  Back pain, left hip pain, no known injury  EXAM: LEFT HIP - COMPLETE 2+ VIEW  COMPARISON:  None.  FINDINGS: There is no evidence of hip fracture or dislocation. Bilateral hip joints are symmetrical in appearance. There is spurring bilateral greater femoral trochanter.  IMPRESSION: No acute fracture or subluxation. Spurring bilateral greater femoral trochanter.   Electronically Signed   By: Natasha Mead M.D.   On: 07/30/2013 18:30    EKG Interpretation   None       MDM   1. Hip pain, left   2. Bone spur    Patient with left hip pain. Afebrile and in no apparent distress. No erythema, warmth or swelling. He is not on any chronic steroid use concerning for avascular necrosis. Neurovascularly intact. No back pain. X-ray of hip and lumbar spine without any acute abnormality. Spurring of the femoral greater trochanter. No relief with anti-inflammatories and muscle relaxers. I will give him 15 Vicodin, crutches. Advised rest, ice. Followup with or so in one week if no improvement. Return precautions discussed. Patient states understanding of treatment care plan and is agreeable.   Trevor Mace, PA-C 07/30/13 1912

## 2013-07-30 NOTE — ED Provider Notes (Signed)
Medical screening examination/treatment/procedure(s) were performed by non-physician practitioner and as supervising physician I was immediately available for consultation/collaboration.      Charles B. Bernette Mayers, MD 07/30/13 2248

## 2013-10-31 ENCOUNTER — Telehealth: Payer: Self-pay | Admitting: Neurology

## 2013-10-31 NOTE — Telephone Encounter (Signed)
PT returned call and gave the following information:  [employer] Customer Service #: [phone]; [employer] [facility] #: [phone].  His ID# [id number].  He didn't see a group number on the card.  The RX BIN# [id number] & RX PCN# [id number].  Please call if there is any additional information needed.  Thank you

## 2013-10-31 NOTE — Telephone Encounter (Signed)
Pt called stating that his new insurance, [employer], requires a pre-authorization of his generic Ambien.  He states they are denying it saying that they want him to try others meds.  Pt states that he has tried others and this is the only one that seems to work.  Please call.

## 2013-10-31 NOTE — Telephone Encounter (Signed)
All info has been submitted to ins.  Pending their response.  They will advise patient of the outcome.

## 2013-10-31 NOTE — Telephone Encounter (Signed)
I need to obtain the patients current prescription ins info in order to send a request for PA.  I called the patient back.  Got no answer.  Left message asking for a return call with Ins phone number, ID # and group.  If he has the BIN number, that would be helpful as well.

## 2013-11-22 ENCOUNTER — Telehealth: Payer: Self-pay | Admitting: Neurology

## 2013-11-22 NOTE — Telephone Encounter (Signed)
New form received, completed and faxed back.

## 2013-11-22 NOTE — Telephone Encounter (Signed)
I called back.  Spoke with [name] (male).  She said they will fax me an updated form, and asked that I complete this form and fax it back to [phone].

## 2013-11-22 NOTE — Telephone Encounter (Signed)
Calling regarding pre-authorization for generic Ambium to [employer]-fax #[phone]--web sight is [email or web address]

## 2013-11-22 NOTE — Telephone Encounter (Signed)
Dorian calling to speak with [name] regarding a wrong form that was submitted. Dorian stated she needs a sleep agent form. She can be reached at [phone] with a reference number of [id number].

## 2013-11-22 NOTE — Telephone Encounter (Signed)
[facility]  I called them.  They said they do not provide any Rx coverage for this patient.  I went to website patient gave of [email or web address].  This is for patients, not providers.  I called [employer].  Automated system said due to inclement weather, they recommend calling back after Feb 20th.  I was able to navigate through the automated system and had it fax me a new form specific for [employer].  I received this form, completed it and faxed it back.  Now it is pending response from correct ins.  Patient may have [employer] for medical coverage since they indicate he does not have drug coverage with them.

## 2014-01-14 ENCOUNTER — Other Ambulatory Visit: Payer: Self-pay | Admitting: Neurology

## 2014-01-15 NOTE — Telephone Encounter (Signed)
Rx signed and faxed.

## 2014-02-19 ENCOUNTER — Telehealth: Payer: Self-pay | Admitting: Neurology

## 2014-02-19 MED ORDER — LORAZEPAM 1 MG PO TABS: 1.0000 mg | ORAL_TABLET | Freq: Three times a day (TID) | ORAL | Status: DC

## 2014-02-19 NOTE — Telephone Encounter (Signed)
Patient is returning your call--thank you. °

## 2014-02-19 NOTE — Telephone Encounter (Signed)
I spoke with the patient and he says he has severe anxiety and panic attacks that keep him from being able to sleep.  He would like to know if Dr Vickey Hugerohmeier will prescribe Ativan for him to take until he is able to see his other provider in August.  Please advise.  Thank you.

## 2014-02-19 NOTE — Telephone Encounter (Signed)
Patient calling to state that his appointment with Dr. Nolen MuMcKinney is not until August 4 (he has been waiting since January for their first available) and states that his anxiety and panic attacks are more severe and are happening every night. Patient requesting a refill of his anxiety medication to last him until he can see Dr. Nolen MuMcKinney.

## 2014-02-19 NOTE — Telephone Encounter (Signed)
[name], patient returning your call.  Thanks  °

## 2014-02-19 NOTE — Telephone Encounter (Signed)
I called the patient back to obtain more specific info regarding the medication he is requesting.  Got no answer.  Left message.

## 2014-03-02 ENCOUNTER — Other Ambulatory Visit: Payer: Self-pay | Admitting: Neurology

## 2014-03-02 NOTE — Telephone Encounter (Signed)
Rx signed and faxed.

## 2014-03-07 ENCOUNTER — Other Ambulatory Visit: Payer: Self-pay | Admitting: Neurology

## 2014-06-21 ENCOUNTER — Ambulatory Visit: Payer: 59 | Admitting: Interventional Cardiology

## 2014-07-13 ENCOUNTER — Ambulatory Visit: Payer: 59 | Admitting: Neurology

## 2014-08-23 ENCOUNTER — Ambulatory Visit: Payer: 59 | Admitting: Neurology

## 2015-09-02 DIAGNOSIS — M47817 Spondylosis without myelopathy or radiculopathy, lumbosacral region: Secondary | ICD-10-CM | POA: Insufficient documentation

## 2016-03-04 ENCOUNTER — Encounter (HOSPITAL_BASED_OUTPATIENT_CLINIC_OR_DEPARTMENT_OTHER): Payer: Self-pay | Admitting: *Deleted

## 2016-03-04 ENCOUNTER — Emergency Department (HOSPITAL_BASED_OUTPATIENT_CLINIC_OR_DEPARTMENT_OTHER): Payer: Worker's Compensation

## 2016-03-04 ENCOUNTER — Emergency Department (HOSPITAL_BASED_OUTPATIENT_CLINIC_OR_DEPARTMENT_OTHER)
Admission: EM | Admit: 2016-03-04 | Discharge: 2016-03-04 | Disposition: A | Discharge status: Home / Self Care | Payer: Worker's Compensation | Attending: Emergency Medicine | Admitting: Emergency Medicine

## 2016-03-04 DIAGNOSIS — Y92410 Unspecified street and highway as the place of occurrence of the external cause: Secondary | ICD-10-CM | POA: Insufficient documentation

## 2016-03-04 DIAGNOSIS — I10 Essential (primary) hypertension: Secondary | ICD-10-CM | POA: Insufficient documentation

## 2016-03-04 DIAGNOSIS — M542 Cervicalgia: Secondary | ICD-10-CM

## 2016-03-04 DIAGNOSIS — Z79899 Other long term (current) drug therapy: Secondary | ICD-10-CM | POA: Diagnosis not present

## 2016-03-04 DIAGNOSIS — M549 Dorsalgia, unspecified: Secondary | ICD-10-CM | POA: Diagnosis not present

## 2016-03-04 DIAGNOSIS — Y9389 Activity, other specified: Secondary | ICD-10-CM | POA: Insufficient documentation

## 2016-03-04 DIAGNOSIS — Y999 Unspecified external cause status: Secondary | ICD-10-CM | POA: Diagnosis not present

## 2016-03-04 DIAGNOSIS — M545 Low back pain, unspecified: Secondary | ICD-10-CM

## 2016-03-04 DIAGNOSIS — E7800 Pure hypercholesterolemia, unspecified: Secondary | ICD-10-CM | POA: Insufficient documentation

## 2016-03-04 MED ORDER — NAPROXEN 500 MG PO TABS: 500.0000 mg | ORAL_TABLET | Freq: Two times a day (BID) | ORAL | Status: DC

## 2016-03-04 MED ORDER — METHOCARBAMOL 500 MG PO TABS: 500.0000 mg | ORAL_TABLET | Freq: Two times a day (BID) | ORAL | Status: DC
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
## 2016-09-01 ENCOUNTER — Telehealth: Payer: Self-pay | Admitting: Interventional Cardiology

## 2016-09-01 NOTE — Telephone Encounter (Signed)
NOTES SENT TO SCHEDULING ON 09/01/16. °

## 2016-09-21 ENCOUNTER — Encounter: Payer: Self-pay | Admitting: Cardiology

## 2016-09-21 ENCOUNTER — Ambulatory Visit (INDEPENDENT_AMBULATORY_CARE_PROVIDER_SITE_OTHER): Payer: 59 | Admitting: Cardiology

## 2016-09-21 VITALS — BP 142/96 | HR 88 | Ht 69.0 in | Wt 234.0 lb

## 2016-09-21 DIAGNOSIS — R7989 Other specified abnormal findings of blood chemistry: Secondary | ICD-10-CM | POA: Diagnosis not present

## 2016-09-21 DIAGNOSIS — Z79899 Other long term (current) drug therapy: Secondary | ICD-10-CM

## 2016-09-21 DIAGNOSIS — E782 Mixed hyperlipidemia: Secondary | ICD-10-CM | POA: Diagnosis not present

## 2016-09-21 DIAGNOSIS — R945 Abnormal results of liver function studies: Secondary | ICD-10-CM

## 2016-09-21 LAB — ALT: ALT: 64 U/L — ABNORMAL HIGH (ref 9–46)

## 2016-09-21 LAB — LIPID PANEL
CHOLESTEROL: 231 mg/dL — AB (ref <200)
HDL: 36 mg/dL — AB (ref >40)
LDL CALC: 164 mg/dL — AB (ref <100)
TRIGLYCERIDES: 156 mg/dL — AB (ref <150)
Total CHOL/HDL Ratio: 6.4 Ratio — ABNORMAL HIGH (ref <5.0)
VLDL: 31 mg/dL — AB (ref <30)

## 2016-09-21 LAB — AST: AST: 52 U/L — ABNORMAL HIGH (ref 10–40)

## 2016-09-21 NOTE — Patient Instructions (Signed)
Medication Instructions:  The current medical regimen is effective;  continue present plan and medications.  Labwork: Please have blood work today (Lipid, AST and ALT)  Follow-Up: Follow up in 1 year with Dr. Anne FuSkains.  You will receive a letter in the mail 2 months before you are due.  Please call us when you receive this letter to schedule your follow up appointment.  If you need a refill on your cardiac medications before your next appointment, please call your pharmacy.  Thank you for choosing [facility]!!

## 2016-09-21 NOTE — Progress Notes (Signed)
Cardiology Office Note    Date:  09/21/2016   ID:  [name], DOB 11/08/1970, MRN [id number]  PCP:  Leota SauersParsons, Holly Swain, RN  Cardiologist:   Donato SchultzMark Franki Alcaide, MD     History of Present Illness:  [name] is a 45 y.o. male here for the evaluation of lipids consultative visit. At the request of Dr. Tally Joeavid Swayne.   In October 2017, LFTs were increased significantly and decreased after stopping statin.  His ALT was 69 with upper limits of normal 52 and AST was 45 with upper limit of normal 39 compared to 107/110.  Cardiac risk factors include hypertension for which she is taking amlodipine and hydrochlorothiazide previously his dose of Crestor was just 5 mg once a day.  Advance lipid profile demonstrated LDL-P of 1142, total cholesterol 141, LDL-C 76, HDL 35, small LDL 687, creatinine 1.37, hemoglobin 17.2  His mother died at age 571 with diabetes, stroke, one brother, 7 sisters healthy. No early family history of CAD. Father died in sleep at age 47.   Had been on Crestor 10 every other day for years. LFT's were up slightly over past year or 2. Was on Crestor 5 QD.  Been off of Crestor for 2 months.   Uses 3 different supplements for body building, some of them contain creatine, caffeine, amino acids.   Past Medical History:  Diagnosis Date  . H/O degenerative disc disease   . H/O laminectomy    lumbar, 2009  . Herniated disc, cervical    L4,L5  . High cholesterol   . Hypercholesteremia   . Hypertension   . Hypogonadism, male   . Insomnia    PTSD  . Low testosterone   . Migraine    once monthly    Past Surgical History:  Procedure Laterality Date  . BACK SURGERY     diskectomy  . LAMINECTOMY  2009  . LUMBAR DISC SURGERY  2009  . SEPTOPLASTY      Current Medications: Outpatient Medications Prior to Visit  Medication Sig Dispense Refill  . amLODipine (NORVASC) 10 MG tablet 10 mg daily.    . QUEtiapine (SEROQUEL) 100 MG tablet TAKE 1 TABLET BY MOUTH AT  BEDTIME 30 tablet 5  . testosterone cypionate (DEPOTESTOTERONE CYPIONATE) 100 MG/ML injection Inject 100 mg into the muscle every 14 (fourteen) days. For IM use only    . zolpidem (AMBIEN) 10 MG tablet TAKE 1 TABLET BY MOUTH AT BEDTIME AS NEEDED FOR SLEEP 30 tablet 5  . hydrOXYzine (ATARAX/VISTARIL) 10 MG tablet     . HYDROcodone-acetaminophen (NORCO/VICODIN) 5-325 MG per tablet Take 1-2 tablets by mouth every 4 (four) hours as needed for pain. 15 tablet 0  . LORazepam (ATIVAN) 1 MG tablet TAKE 1 TABLET BY MOUTH EVERY 8 HOURS 90 tablet 3  . meloxicam (MOBIC) 15 MG tablet     . methocarbamol (ROBAXIN) 500 MG tablet Take 1 tablet (500 mg total) by mouth 2 (two) times daily. 20 tablet 0  . naproxen (NAPROSYN) 500 MG tablet Take 1 tablet (500 mg total) by mouth 2 (two) times daily. 30 tablet 0  . rosuvastatin (CRESTOR) 10 MG tablet Take 5 mg by mouth daily.      No facility-administered medications prior to visit.      Allergies:   Losartan   Social History   Social History  . Marital status: Married    Spouse name: N/A  . Number of children: N/A  . Years of education: N/A  Occupational History  . [employer]    has an associate degree   Social History Main Topics  . Smoking status: Never Smoker  . Smokeless tobacco: None  . Alcohol use Yes     Comment: occasionally, one glass of wine monthly  . Drug use: No  . Sexual activity: Not Asked   Other Topics Concern  . None   Social History Narrative  . None     Family History:  The patient's family history includes Diabetes in his father and mother; Hypertension in his mother; Migraines in his sister; Stroke in his mother.   ROS:   Please see the history of present illness.    ROS All other systems reviewed and are negative.   PHYSICAL EXAM:   VS:  BP (!) 142/96 (BP Location: Left Arm, Cuff Size: Normal)   Pulse 88   Ht 5\' 9"  (1.753 m)   Wt 234 lb (106.1 kg)   BMI 34.56 kg/m    GEN: Well  nourished, well developed, body builder, in no acute distress  HEENT: normal  Neck: no JVD, carotid bruits, or masses Cardiac: RRR; no murmurs, rubs, or gallops,no edema  Respiratory:  clear to auscultation bilaterally, normal work of breathing GI: soft, nontender, nondistended, + BS MS: no deformity or atrophy  Skin: warm and dry, no rash Neuro:  Alert and Oriented x 3, Strength and sensation are intact Psych: euthymic mood, full affect  Wt Readings from Last 3 Encounters:  09/21/16 234 lb (106.1 kg)  03/04/16 225 lb (102.1 kg)  07/30/13 235 lb (106.6 kg)      Studies/Labs Reviewed:   EKG:  EKG is ordered today.  The ekg ordered today demonstrates 09/21/16-sinus rhythm, 88, vertical axis-reassuring personally viewed  Recent Labs: No results found for requested labs within last 8760 hours.   Lipid Panel No results found for: CHOL, TRIG, HDL, CHOLHDL, VLDL, LDLCALC, LDLDIRECT  Additional studies/ records that were reviewed today include:  Prior lab work, office notes come EKG reviewed    ASSESSMENT:    1. Mixed hyperlipidemia   2. Long-term use of high-risk medication   3. Elevated LFTs      PLAN:  In order of problems listed above:  Elevated LFTs in the setting of statin use  - He has been off of Crestor 5 mg once a day for the past 2 months.  - We will recheck AST, ALT and lipid panel  - Previously had elevated small LDL numbers  - No early family history of CAD.  - If AST, ALT are normalized, we will trial pravastatin 20 mg once a day and recheck in 2 months.  - If AST and ALT are still elevated, we will ask him to his continue his supplements that he is using and then recheck in 2 months.  - Continue with diet, exercise. He has had no limitations from exercising perspective. No anginal symptoms, no shortness of breath, no chest pain.  Elevated blood pressure/hypertension  - Elevated today. On amlodipine. Continuing to monitor with Dr. Azucena Cecil. Goal is less than  130/80. Previous shortness of breath and palpitations on losartan/hydrochlorothiazide.     Medication Adjustments/Labs and Tests Ordered: Current medicines are reviewed at length with the patient today.  Concerns regarding medicines are outlined above.  Medication changes, Labs and Tests ordered today are listed in the Patient Instructions below. Patient Instructions  Medication Instructions:  The current medical regimen is effective;  continue present plan and medications.  Labwork: Please have blood work today (Lipid, AST and ALT)  Follow-Up: Follow up in 1 year with Dr. Anne FuSkains.  You will receive a letter in the mail 2 months before you are due.  Please call us when you receive this letter to schedule your follow up appointment.  If you need a refill on your cardiac medications before your next appointment, please call your pharmacy.  [facility]!!        Signed, Donato SchultzMark Orpheus Hayhurst, MD  09/21/2016 10:46 AM    [facility] [address], [place], [place]  [address] Phone: [phone]; Fax: [phone]

## 2016-09-22 ENCOUNTER — Telehealth: Payer: Self-pay | Admitting: Cardiology

## 2016-09-22 DIAGNOSIS — R748 Abnormal levels of other serum enzymes: Secondary | ICD-10-CM

## 2016-09-22 NOTE — Telephone Encounter (Signed)
Patient informed and verbalized understanding of plan. 

## 2016-09-22 NOTE — Telephone Encounter (Signed)
Notes Recorded by Jake BatheMark C Skains, MD on 09/21/2016 at 4:54 PM EST ALT 64, AST 52. LDL cholesterol off of medications is 164, triglycerides 156 as expected and increase has occurred in cholesterol panel. Since his liver function is still elevated, let's discontinue his 3 supplements that he uses which include creatine, amino acids. After discontinuing this, let's recheck the AST, ALT in 2 months. I want to hold off on resuming statin therapy at this time. If we do restart a statin, it will likely be pravastatin. Donato SchultzMark Skains, MD

## 2016-09-22 NOTE — Telephone Encounter (Signed)
New message   Patient return call back to nurse from yesterday  - lab results

## 2016-11-23 ENCOUNTER — Other Ambulatory Visit: Payer: 59 | Admitting: *Deleted

## 2016-11-23 DIAGNOSIS — R748 Abnormal levels of other serum enzymes: Secondary | ICD-10-CM

## 2016-11-23 LAB — ALT: ALT: 135 IU/L — ABNORMAL HIGH (ref 0–44)

## 2016-11-23 LAB — AST: AST: 65 IU/L — ABNORMAL HIGH (ref 0–40)

## 2016-11-24 ENCOUNTER — Telehealth: Payer: Self-pay | Admitting: Cardiology

## 2016-11-24 ENCOUNTER — Other Ambulatory Visit: Payer: Self-pay | Admitting: *Deleted

## 2016-11-24 DIAGNOSIS — R945 Abnormal results of liver function studies: Principal | ICD-10-CM

## 2016-11-24 DIAGNOSIS — R7989 Other specified abnormal findings of blood chemistry: Secondary | ICD-10-CM

## 2016-11-24 NOTE — Telephone Encounter (Signed)
Patient states that he is returning a call from yesterday in regards to lab results. I am not sure who called. Thanks.

## 2016-11-25 ENCOUNTER — Encounter: Payer: Self-pay | Admitting: Gastroenterology

## 2016-12-28 ENCOUNTER — Ambulatory Visit: Payer: 59 | Admitting: Gastroenterology

## 2017-02-19 ENCOUNTER — Ambulatory Visit: Payer: Self-pay | Admitting: Physician Assistant

## 2017-02-23 ENCOUNTER — Encounter (HOSPITAL_COMMUNITY): Payer: Self-pay | Admitting: *Deleted

## 2017-02-23 NOTE — Progress Notes (Signed)
Pt denies SOB and chest pain. Pt under the care of Dr. Anne FuSkains, Cardiology. Pt denies having a stress test, echo and cardiac cath. Pt denies having a chest x ray within the last year. Pt denies having recent labs. Pt made aware to stop taking  Aspirin, vitamins, fish oil and herbal medications. Do not take any NSAIDs ie: Ibuprofen, Advil, Naproxen ( Aleve)< BC and Goody Powder or any medication containing Aspirin. Pt verbalized understanding of all pre-op instructions. Anesthesia asked to review pt history.

## 2017-02-24 ENCOUNTER — Encounter (HOSPITAL_COMMUNITY): Payer: Self-pay

## 2017-02-24 ENCOUNTER — Observation Stay (HOSPITAL_COMMUNITY): Payer: Worker's Compensation

## 2017-02-24 ENCOUNTER — Ambulatory Visit (HOSPITAL_COMMUNITY): Payer: Worker's Compensation | Admitting: Certified Registered Nurse Anesthetist

## 2017-02-24 ENCOUNTER — Observation Stay (HOSPITAL_COMMUNITY)
Admission: RE | Admit: 2017-02-24 | Discharge: 2017-02-25 | Disposition: A | Discharge status: Home / Self Care | Payer: Worker's Compensation | Source: Ambulatory Visit | Attending: Orthopedic Surgery | Admitting: Orthopedic Surgery

## 2017-02-24 ENCOUNTER — Encounter (HOSPITAL_COMMUNITY)
Admission: RE | Disposition: A | Discharge status: Home / Self Care | Payer: Self-pay | Source: Ambulatory Visit | Attending: Orthopedic Surgery

## 2017-02-24 ENCOUNTER — Ambulatory Visit (HOSPITAL_COMMUNITY): Payer: Worker's Compensation

## 2017-02-24 DIAGNOSIS — Z79899 Other long term (current) drug therapy: Secondary | ICD-10-CM | POA: Insufficient documentation

## 2017-02-24 DIAGNOSIS — E7800 Pure hypercholesterolemia, unspecified: Secondary | ICD-10-CM | POA: Insufficient documentation

## 2017-02-24 DIAGNOSIS — M50123 Cervical disc disorder at C6-C7 level with radiculopathy: Principal | ICD-10-CM | POA: Insufficient documentation

## 2017-02-24 DIAGNOSIS — Z419 Encounter for procedure for purposes other than remedying health state, unspecified: Secondary | ICD-10-CM

## 2017-02-24 DIAGNOSIS — M542 Cervicalgia: Secondary | ICD-10-CM | POA: Diagnosis present

## 2017-02-24 DIAGNOSIS — F4310 Post-traumatic stress disorder, unspecified: Secondary | ICD-10-CM | POA: Diagnosis not present

## 2017-02-24 DIAGNOSIS — E291 Testicular hypofunction: Secondary | ICD-10-CM | POA: Diagnosis not present

## 2017-02-24 DIAGNOSIS — Z981 Arthrodesis status: Secondary | ICD-10-CM

## 2017-02-24 DIAGNOSIS — I10 Essential (primary) hypertension: Secondary | ICD-10-CM | POA: Diagnosis not present

## 2017-02-24 HISTORY — DX: Cervical disc disorder with radiculopathy, unspecified cervical region: M50.10

## 2017-02-24 HISTORY — DX: Abnormal levels of other serum enzymes: R74.8

## 2017-02-24 HISTORY — PX: CERVICAL DISC ARTHROPLASTY: SHX587

## 2017-02-24 HISTORY — DX: Anxiety disorder, unspecified: F41.9

## 2017-02-24 LAB — COMPREHENSIVE METABOLIC PANEL
ALT: 63 U/L (ref 17–63)
ANION GAP: 8 (ref 5–15)
AST: 47 U/L — ABNORMAL HIGH (ref 15–41)
Albumin: 4.5 g/dL (ref 3.5–5.0)
Alkaline Phosphatase: 74 U/L (ref 38–126)
BUN: 11 mg/dL (ref 6–20)
CHLORIDE: 108 mmol/L (ref 101–111)
CO2: 24 mmol/L (ref 22–32)
Calcium: 9.1 mg/dL (ref 8.9–10.3)
Creatinine, Ser: 1.32 mg/dL — ABNORMAL HIGH (ref 0.61–1.24)
Glucose, Bld: 82 mg/dL (ref 65–99)
Potassium: 4 mmol/L (ref 3.5–5.1)
SODIUM: 140 mmol/L (ref 135–145)
Total Bilirubin: 1.2 mg/dL (ref 0.3–1.2)
Total Protein: 7 g/dL (ref 6.5–8.1)

## 2017-02-24 LAB — CBC
HEMATOCRIT: 51.6 % (ref 39.0–52.0)
Hemoglobin: 16.9 g/dL (ref 13.0–17.0)
MCH: 29.1 pg (ref 26.0–34.0)
MCHC: 32.8 g/dL (ref 30.0–36.0)
MCV: 88.8 fL (ref 78.0–100.0)
PLATELETS: 222 10*3/uL (ref 150–400)
RBC: 5.81 MIL/uL (ref 4.22–5.81)
RDW: 13.2 % (ref 11.5–15.5)
WBC: 9 10*3/uL (ref 4.0–10.5)

## 2017-02-24 LAB — TYPE AND SCREEN
ABO/RH(D): O POS
ANTIBODY SCREEN: NEGATIVE

## 2017-02-24 SURGERY — CERVICAL ANTERIOR DISC ARTHROPLASTY
Anesthesia: General | Site: Neck

## 2017-02-24 MED ORDER — ONDANSETRON HCL 4 MG PO TABS: 4.0000 mg | ORAL_TABLET | Freq: Four times a day (QID) | ORAL | Status: DC | PRN

## 2017-02-24 MED ORDER — HYDROCHLOROTHIAZIDE 25 MG PO TABS
25.0000 mg | ORAL_TABLET | Freq: Every day | ORAL | Status: DC
  Filled 2017-02-24 (×2): qty 1

## 2017-02-24 MED ORDER — METHOCARBAMOL 500 MG PO TABS
ORAL_TABLET | ORAL | Status: AC
  Filled 2017-02-24: qty 1

## 2017-02-24 MED ORDER — SODIUM CHLORIDE 0.9 % IV SOLN: 250.0000 mL | INTRAVENOUS | Status: DC

## 2017-02-24 MED ORDER — ONDANSETRON HCL 4 MG/2ML IJ SOLN: 4.0000 mg | Freq: Four times a day (QID) | INTRAMUSCULAR | Status: DC | PRN

## 2017-02-24 MED ORDER — PROPOFOL 10 MG/ML IV BOLUS
INTRAVENOUS | Status: AC
  Filled 2017-02-24: qty 20

## 2017-02-24 MED ORDER — ACETAMINOPHEN 650 MG RE SUPP: 650.0000 mg | RECTAL | Status: DC | PRN

## 2017-02-24 MED ORDER — 0.9 % SODIUM CHLORIDE (POUR BTL) OPTIME
TOPICAL | Status: DC | PRN
  Administered 2017-02-24: 1000 mL

## 2017-02-24 MED ORDER — METHOCARBAMOL 500 MG PO TABS
500.0000 mg | ORAL_TABLET | Freq: Four times a day (QID) | ORAL | Status: DC | PRN
  Administered 2017-02-24 – 2017-02-25 (×2): 500 mg via ORAL
  Filled 2017-02-24: qty 1

## 2017-02-24 MED ORDER — DEXAMETHASONE SODIUM PHOSPHATE 10 MG/ML IJ SOLN
INTRAMUSCULAR | Status: DC | PRN
  Administered 2017-02-24: 10 mg via INTRAVENOUS

## 2017-02-24 MED ORDER — ROSUVASTATIN CALCIUM 5 MG PO TABS
5.0000 mg | ORAL_TABLET | Freq: Every day | ORAL | Status: DC
  Administered 2017-02-24: 5 mg via ORAL
  Filled 2017-02-24: qty 1

## 2017-02-24 MED ORDER — BUPIVACAINE HCL (PF) 0.25 % IJ SOLN
INTRAMUSCULAR | Status: AC
  Filled 2017-02-24: qty 30

## 2017-02-24 MED ORDER — ONDANSETRON HCL 4 MG/2ML IJ SOLN
INTRAMUSCULAR | Status: DC | PRN
  Administered 2017-02-24: 4 mg via INTRAVENOUS

## 2017-02-24 MED ORDER — AMLODIPINE BESYLATE 10 MG PO TABS
10.0000 mg | ORAL_TABLET | Freq: Every day | ORAL | Status: DC
  Administered 2017-02-24: 10 mg via ORAL
  Filled 2017-02-24: qty 1

## 2017-02-24 MED ORDER — MIDAZOLAM HCL 2 MG/2ML IJ SOLN
INTRAMUSCULAR | Status: DC | PRN
  Administered 2017-02-24 (×2): 1 mg via INTRAVENOUS

## 2017-02-24 MED ORDER — SUGAMMADEX SODIUM 200 MG/2ML IV SOLN
INTRAVENOUS | Status: DC | PRN
  Administered 2017-02-24: 200 mg via INTRAVENOUS

## 2017-02-24 MED ORDER — MEPERIDINE HCL 25 MG/ML IJ SOLN: 6.2500 mg | INTRAMUSCULAR | Status: DC | PRN

## 2017-02-24 MED ORDER — HYDROCODONE-ACETAMINOPHEN 7.5-325 MG PO TABS: 1.0000 | ORAL_TABLET | Freq: Once | ORAL | Status: DC | PRN

## 2017-02-24 MED ORDER — ARTIFICIAL TEARS OPHTHALMIC OINT
TOPICAL_OINTMENT | OPHTHALMIC | Status: DC | PRN
  Administered 2017-02-24: 1 via OPHTHALMIC

## 2017-02-24 MED ORDER — LACTATED RINGERS IV SOLN
INTRAVENOUS | Status: DC
  Administered 2017-02-24: 21:00:00 via INTRAVENOUS

## 2017-02-24 MED ORDER — DEXAMETHASONE 4 MG PO TABS
4.0000 mg | ORAL_TABLET | Freq: Four times a day (QID) | ORAL | Status: AC
  Administered 2017-02-24 – 2017-02-25 (×2): 4 mg via ORAL
  Filled 2017-02-24 (×2): qty 1

## 2017-02-24 MED ORDER — ONDANSETRON HCL 4 MG PO TABS: 4.0000 mg | ORAL_TABLET | Freq: Three times a day (TID) | ORAL | 0 refills | Status: DC | PRN

## 2017-02-24 MED ORDER — SUGAMMADEX SODIUM 200 MG/2ML IV SOLN
INTRAVENOUS | Status: AC
  Filled 2017-02-24: qty 2

## 2017-02-24 MED ORDER — LIDOCAINE HCL (CARDIAC) 20 MG/ML IV SOLN
INTRAVENOUS | Status: DC | PRN
  Administered 2017-02-24: 100 mg via INTRATRACHEAL

## 2017-02-24 MED ORDER — METHOCARBAMOL 1000 MG/10ML IJ SOLN
500.0000 mg | Freq: Four times a day (QID) | INTRAMUSCULAR | Status: DC | PRN
  Filled 2017-02-24: qty 5

## 2017-02-24 MED ORDER — ALBUMIN HUMAN 5 % IV SOLN
INTRAVENOUS | Status: DC | PRN
  Administered 2017-02-24 (×2): via INTRAVENOUS

## 2017-02-24 MED ORDER — LIDOCAINE 2% (20 MG/ML) 5 ML SYRINGE
INTRAMUSCULAR | Status: AC
  Filled 2017-02-24: qty 5

## 2017-02-24 MED ORDER — ROCURONIUM BROMIDE 100 MG/10ML IV SOLN
INTRAVENOUS | Status: DC | PRN
  Administered 2017-02-24: 30 mg via INTRAVENOUS
  Administered 2017-02-24 (×2): 25 mg via INTRAVENOUS
  Administered 2017-02-24: 40 mg via INTRAVENOUS
  Administered 2017-02-24: 60 mg via INTRAVENOUS

## 2017-02-24 MED ORDER — PHENOL 1.4 % MT LIQD: 1.0000 | OROMUCOSAL | Status: DC | PRN

## 2017-02-24 MED ORDER — BUPIVACAINE-EPINEPHRINE 0.25% -1:200000 IJ SOLN
INTRAMUSCULAR | Status: DC | PRN
  Administered 2017-02-24: 7 mL

## 2017-02-24 MED ORDER — FENTANYL CITRATE (PF) 250 MCG/5ML IJ SOLN
INTRAMUSCULAR | Status: DC | PRN
  Administered 2017-02-24: 200 ug via INTRAVENOUS
  Administered 2017-02-24 (×2): 25 ug via INTRAVENOUS

## 2017-02-24 MED ORDER — ACETAMINOPHEN 10 MG/ML IV SOLN
INTRAVENOUS | Status: DC | PRN
  Administered 2017-02-24: 1000 mg via INTRAVENOUS

## 2017-02-24 MED ORDER — OXYCODONE HCL 5 MG PO TABS
ORAL_TABLET | ORAL | Status: AC
  Filled 2017-02-24: qty 2

## 2017-02-24 MED ORDER — ACETAMINOPHEN 325 MG PO TABS
650.0000 mg | ORAL_TABLET | ORAL | Status: DC | PRN
  Administered 2017-02-24: 650 mg via ORAL
  Filled 2017-02-24: qty 2

## 2017-02-24 MED ORDER — DEXAMETHASONE SODIUM PHOSPHATE 10 MG/ML IJ SOLN
INTRAMUSCULAR | Status: AC
  Filled 2017-02-24: qty 1

## 2017-02-24 MED ORDER — HYDROMORPHONE HCL 1 MG/ML IJ SOLN
0.2500 mg | INTRAMUSCULAR | Status: DC | PRN
  Administered 2017-02-24 (×3): 0.5 mg via INTRAVENOUS

## 2017-02-24 MED ORDER — HYDROMORPHONE HCL 1 MG/ML IJ SOLN
INTRAMUSCULAR | Status: AC
  Filled 2017-02-24: qty 0.5

## 2017-02-24 MED ORDER — MENTHOL 3 MG MT LOZG: 1.0000 | LOZENGE | OROMUCOSAL | Status: DC | PRN

## 2017-02-24 MED ORDER — EPHEDRINE 5 MG/ML INJ
INTRAVENOUS | Status: AC
  Filled 2017-02-24: qty 10

## 2017-02-24 MED ORDER — OXYCODONE-ACETAMINOPHEN 10-325 MG PO TABS: 1.0000 | ORAL_TABLET | ORAL | 0 refills | Status: DC | PRN

## 2017-02-24 MED ORDER — EPHEDRINE SULFATE 50 MG/ML IJ SOLN
INTRAMUSCULAR | Status: DC | PRN
  Administered 2017-02-24: 15 mg via INTRAVENOUS
  Administered 2017-02-24: 20 mg via INTRAVENOUS
  Administered 2017-02-24: 5 mg via INTRAVENOUS
  Administered 2017-02-24: 10 mg via INTRAVENOUS

## 2017-02-24 MED ORDER — PROMETHAZINE HCL 25 MG/ML IJ SOLN: 6.2500 mg | INTRAMUSCULAR | Status: DC | PRN

## 2017-02-24 MED ORDER — CEFAZOLIN SODIUM-DEXTROSE 2-4 GM/100ML-% IV SOLN
2.0000 g | Freq: Three times a day (TID) | INTRAVENOUS | Status: AC
  Administered 2017-02-24 – 2017-02-25 (×2): 2 g via INTRAVENOUS
  Filled 2017-02-24 (×3): qty 100

## 2017-02-24 MED ORDER — PROPOFOL 10 MG/ML IV BOLUS
INTRAVENOUS | Status: DC | PRN
  Administered 2017-02-24: 200 mg via INTRAVENOUS

## 2017-02-24 MED ORDER — DEXAMETHASONE SODIUM PHOSPHATE 4 MG/ML IJ SOLN
4.0000 mg | Freq: Four times a day (QID) | INTRAMUSCULAR | Status: AC
  Filled 2017-02-24: qty 1

## 2017-02-24 MED ORDER — SODIUM CHLORIDE 0.9% FLUSH
3.0000 mL | Freq: Two times a day (BID) | INTRAVENOUS | Status: DC
  Administered 2017-02-24: 3 mL via INTRAVENOUS

## 2017-02-24 MED ORDER — GELATIN ABSORBABLE MT POWD
OROMUCOSAL | Status: DC | PRN
  Administered 2017-02-24: 14:00:00 via TOPICAL

## 2017-02-24 MED ORDER — GLYCOPYRROLATE 0.2 MG/ML IJ SOLN
INTRAMUSCULAR | Status: DC | PRN
  Administered 2017-02-24: 0.2 mg via INTRAVENOUS

## 2017-02-24 MED ORDER — QUETIAPINE FUMARATE 50 MG PO TABS
200.0000 mg | ORAL_TABLET | Freq: Every day | ORAL | Status: DC
  Administered 2017-02-24: 200 mg via ORAL
  Filled 2017-02-24: qty 4

## 2017-02-24 MED ORDER — ALBUTEROL SULFATE HFA 108 (90 BASE) MCG/ACT IN AERS
INHALATION_SPRAY | RESPIRATORY_TRACT | Status: DC | PRN
  Administered 2017-02-24 (×3): 2 via RESPIRATORY_TRACT

## 2017-02-24 MED ORDER — EPINEPHRINE PF 1 MG/ML IJ SOLN
INTRAMUSCULAR | Status: AC
  Filled 2017-02-24: qty 1

## 2017-02-24 MED ORDER — FENTANYL CITRATE (PF) 250 MCG/5ML IJ SOLN
INTRAMUSCULAR | Status: AC
  Filled 2017-02-24: qty 5

## 2017-02-24 MED ORDER — PHENYLEPHRINE HCL 10 MG/ML IJ SOLN
INTRAMUSCULAR | Status: DC | PRN
  Administered 2017-02-24: 60 ug/min via INTRAVENOUS

## 2017-02-24 MED ORDER — MIDAZOLAM HCL 2 MG/2ML IJ SOLN
INTRAMUSCULAR | Status: AC
  Filled 2017-02-24: qty 2

## 2017-02-24 MED ORDER — HYDROMORPHONE HCL 1 MG/ML IJ SOLN
1.0000 mg | INTRAMUSCULAR | Status: DC | PRN
  Administered 2017-02-25: 1 mg via INTRAVENOUS
  Filled 2017-02-24: qty 1

## 2017-02-24 MED ORDER — CEFAZOLIN SODIUM-DEXTROSE 2-4 GM/100ML-% IV SOLN
2.0000 g | INTRAVENOUS | Status: AC
  Administered 2017-02-24: 2 g via INTRAVENOUS

## 2017-02-24 MED ORDER — SODIUM CHLORIDE 0.9% FLUSH: 3.0000 mL | INTRAVENOUS | Status: DC | PRN

## 2017-02-24 MED ORDER — POLYETHYLENE GLYCOL 3350 17 G PO PACK: 17.0000 g | PACK | Freq: Every day | ORAL | Status: DC | PRN

## 2017-02-24 MED ORDER — THROMBIN 20000 UNITS EX SOLR
CUTANEOUS | Status: AC
  Filled 2017-02-24: qty 20000

## 2017-02-24 MED ORDER — ACETAMINOPHEN 10 MG/ML IV SOLN
INTRAVENOUS | Status: AC
  Filled 2017-02-24: qty 100

## 2017-02-24 MED ORDER — OXYCODONE HCL 5 MG PO TABS
10.0000 mg | ORAL_TABLET | ORAL | Status: DC | PRN
  Administered 2017-02-24 – 2017-02-25 (×4): 10 mg via ORAL
  Filled 2017-02-24 (×3): qty 2

## 2017-02-24 MED ORDER — METHOCARBAMOL 500 MG PO TABS: 500.0000 mg | ORAL_TABLET | Freq: Three times a day (TID) | ORAL | 0 refills | Status: DC | PRN

## 2017-02-24 MED ORDER — ZOLPIDEM TARTRATE 5 MG PO TABS
10.0000 mg | ORAL_TABLET | Freq: Every evening | ORAL | Status: DC | PRN
  Administered 2017-02-24: 10 mg via ORAL
  Filled 2017-02-24: qty 2

## 2017-02-24 MED ORDER — ROCURONIUM BROMIDE 10 MG/ML (PF) SYRINGE
PREFILLED_SYRINGE | INTRAVENOUS | Status: AC
  Filled 2017-02-24: qty 5

## 2017-02-24 MED ORDER — ONDANSETRON HCL 4 MG/2ML IJ SOLN
INTRAMUSCULAR | Status: AC
  Filled 2017-02-24: qty 2

## 2017-02-24 MED ORDER — ALBUTEROL SULFATE HFA 108 (90 BASE) MCG/ACT IN AERS
INHALATION_SPRAY | RESPIRATORY_TRACT | Status: AC
  Filled 2017-02-24: qty 6.7

## 2017-02-24 MED ORDER — LAMOTRIGINE 100 MG PO TABS
200.0000 mg | ORAL_TABLET | Freq: Every day | ORAL | Status: DC
  Administered 2017-02-25: 200 mg via ORAL
  Filled 2017-02-24: qty 2

## 2017-02-24 MED ORDER — LACTATED RINGERS IV SOLN
INTRAVENOUS | Status: DC
  Administered 2017-02-24 (×3): via INTRAVENOUS

## 2017-02-24 SURGICAL SUPPLY — 57 items
BIT MILLING PRODISC 2.0 STER (BIT) ×2 IMPLANT
BLADE CLIPPER SURG (BLADE) IMPLANT
BUR EGG ELITE 4.0 (BURR) IMPLANT
BUR MATCHSTICK NEURO 3.0 LAGG (BURR) IMPLANT
CANISTER SUCT 3000ML PPV (MISCELLANEOUS) ×2 IMPLANT
CLSR STERI-STRIP ANTIMIC 1/2X4 (GAUZE/BANDAGES/DRESSINGS) ×2 IMPLANT
COVER MAYO STAND STRL (DRAPES) ×4 IMPLANT
COVER SURGICAL LIGHT HANDLE (MISCELLANEOUS) ×4 IMPLANT
CRADLE DONUT ADULT HEAD (MISCELLANEOUS) ×2 IMPLANT
DISC PRODISC-C LRG 5MM (Neuro Prosthesis/Implant) ×2 IMPLANT
DRAPE C-ARM 42X72 X-RAY (DRAPES) ×2 IMPLANT
DRAPE C-ARMOR (DRAPES) ×2 IMPLANT
DRAPE POUCH INSTRU U-SHP 10X18 (DRAPES) ×2 IMPLANT
DRAPE SURG 17X23 STRL (DRAPES) ×2 IMPLANT
DRAPE U-SHAPE 47X51 STRL (DRAPES) ×2 IMPLANT
DRESSING AQUACEL AQ EXTRA 4X5 (GAUZE/BANDAGES/DRESSINGS) ×2 IMPLANT
DRSG MEPILEX BORDER 4X4 (GAUZE/BANDAGES/DRESSINGS) ×2 IMPLANT
DURAPREP 6ML APPLICATOR 50/CS (WOUND CARE) ×2 IMPLANT
ELECT COATED BLADE 2.86 ST (ELECTRODE) ×2 IMPLANT
ELECT PENCIL ROCKER SW 15FT (MISCELLANEOUS) ×2 IMPLANT
ELECT REM PT RETURN 9FT ADLT (ELECTROSURGICAL) ×2
ELECTRODE REM PT RTRN 9FT ADLT (ELECTROSURGICAL) ×1 IMPLANT
GLOVE BIOGEL PI IND STRL 6.5 (GLOVE) ×1 IMPLANT
GLOVE BIOGEL PI IND STRL 8.5 (GLOVE) ×1 IMPLANT
GLOVE BIOGEL PI INDICATOR 6.5 (GLOVE) ×1
GLOVE BIOGEL PI INDICATOR 8.5 (GLOVE) ×1
GLOVE SS BIOGEL STRL SZ 8.5 (GLOVE) ×1 IMPLANT
GLOVE SUPERSENSE BIOGEL SZ 8.5 (GLOVE) ×1
GOWN STRL REUS W/ TWL LRG LVL3 (GOWN DISPOSABLE) ×1 IMPLANT
GOWN STRL REUS W/TWL 2XL LVL3 (GOWN DISPOSABLE) ×4 IMPLANT
GOWN STRL REUS W/TWL LRG LVL3 (GOWN DISPOSABLE) ×1
IMPL INSERTER TIP LRG 5MM (Insert) ×1 IMPLANT
IMPLANT INSERTER TIP LRG 5MM (Insert) ×2 IMPLANT
KIT BASIN OR (CUSTOM PROCEDURE TRAY) ×2 IMPLANT
KIT ROOM TURNOVER OR (KITS) ×2 IMPLANT
NEEDLE SPNL 18GX3.5 QUINCKE PK (NEEDLE) ×2 IMPLANT
NS IRRIG 1000ML POUR BTL (IV SOLUTION) ×2 IMPLANT
PACK ORTHO CERVICAL (CUSTOM PROCEDURE TRAY) ×2 IMPLANT
PACK UNIVERSAL I (CUSTOM PROCEDURE TRAY) ×2 IMPLANT
PAD ARMBOARD 7.5X6 YLW CONV (MISCELLANEOUS) ×4 IMPLANT
PIN RETAINER PRODISC 14 MM (PIN) ×4 IMPLANT
RESTRAINT LIMB HOLDER UNIV (RESTRAINTS) ×2 IMPLANT
SPONGE INTESTINAL PEANUT (DISPOSABLE) ×2 IMPLANT
SPONGE SURGIFOAM ABS GEL 100 (HEMOSTASIS) ×2 IMPLANT
SURGIFLO W/THROMBIN 8M KIT (HEMOSTASIS) ×2 IMPLANT
SUT BONE WAX W31G (SUTURE) ×2 IMPLANT
SUT MNCRL AB 3-0 PS2 27 (SUTURE) ×2 IMPLANT
SUT SILK 3 0 TIES 17X18 (SUTURE) ×1
SUT SILK 3-0 18XBRD TIE BLK (SUTURE) ×1 IMPLANT
SUT VIC AB 2-0 CT1 18 (SUTURE) ×2 IMPLANT
SYR BULB IRRIGATION 50ML (SYRINGE) ×2 IMPLANT
SYR CONTROL 10ML LL (SYRINGE) ×2 IMPLANT
TAPE CLOTH 4X10 WHT NS (GAUZE/BANDAGES/DRESSINGS) ×2 IMPLANT
TAPE UMBILICAL COTTON 1/8X30 (MISCELLANEOUS) ×2 IMPLANT
TOWEL OR 17X24 6PK STRL BLUE (TOWEL DISPOSABLE) ×2 IMPLANT
TOWEL OR 17X26 10 PK STRL BLUE (TOWEL DISPOSABLE) ×2 IMPLANT
WATER STERILE IRR 1000ML POUR (IV SOLUTION) ×2 IMPLANT

## 2017-02-24 NOTE — Discharge Instructions (Signed)

## 2017-02-24 NOTE — Anesthesia Postprocedure Evaluation (Signed)
Anesthesia Post Note  Patient: [name]  Procedure(s) Performed: Procedure(s) (LRB): CERVICAL ANTERIOR DISC ARTHROPLASTY [name] (N/A)  Patient location during evaluation: PACU Anesthesia Type: General Level of consciousness: awake and alert Pain management: pain level controlled Vital Signs Assessment: post-procedure vital signs reviewed and stable Respiratory status: spontaneous breathing, nonlabored ventilation and respiratory function stable Cardiovascular status: blood pressure returned to baseline and stable Postop Assessment: no signs of nausea or vomiting Anesthetic complications: no       Last Vitals:  Vitals:   02/24/17 1645 02/24/17 1646  BP:  (!) 146/80  Pulse: 100 (!) 101  Resp:  16  Temp: 36.5 C     Last Pain:  Vitals:   02/24/17 1715  TempSrc:   PainSc: (P) 10-Worst pain ever                 Lowella CurbWarren Ray Miller

## 2017-02-24 NOTE — Brief Op Note (Signed)
02/24/2017  4:34 PM  PATIENT:  [name]  46 y.o. 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  PROCEDURE:  Procedure(s) with comments: CERVICAL ANTERIOR DISC ARTHROPLASTY [name] (N/A) - 3 hrs  SURGEON:  Surgeon(s) and Role:    Venita Lick* Melinda Gwinner, MD - Primary  PHYSICIAN ASSISTANT: none   ASSISTANTS: none   ANESTHESIA:   general  EBL:  Total I/O In: 2800 [I.V.:2300; IV Piggyback:500] Out: 950 [Urine:900; Blood:50]  BLOOD ADMINISTERED:none  DRAINS: none   LOCAL MEDICATIONS USED:  marcaine  SPECIMEN:  No Specimen  DISPOSITION OF SPECIMEN:  N/A  COUNTS:  YES  TOURNIQUET:  * No tourniquets in log *  DICTATION: .Other Dictation: Dictation Number [id number]  PLAN OF CARE: Admit for overnight observation  PATIENT DISPOSITION:  PACU - hemodynamically stable.

## 2017-02-24 NOTE — Anesthesia Procedure Notes (Signed)
Procedure Name: Intubation Date/Time: 02/24/2017 1:30 PM Performed by: Mal AmabileFOSTER, MICHAEL Pre-anesthesia Checklist: Patient identified, Emergency Drugs available, Suction available and Patient being monitored Patient Re-evaluated:Patient Re-evaluated prior to inductionOxygen Delivery Method: Circle system utilized Preoxygenation: Pre-oxygenation with 100% oxygen Intubation Type: IV induction Ventilation: Mask ventilation without difficulty Laryngoscope Size: Glidescope (T3) Grade View: Grade I Tube type: Oral Tube size: 7.0 mm Number of attempts: 1 Airway Equipment and Method: Video-laryngoscopy Placement Confirmation: ETT inserted through vocal cords under direct vision,  positive ETCO2 and breath sounds checked- equal and bilateral Secured at: 22 cm Dental Injury: Teeth and Oropharynx as per pre-operative assessment and Injury to lip  Difficulty Due To: Difficulty was anticipated

## 2017-02-24 NOTE — Progress Notes (Addendum)
Patient arrived to unit A&O x4, vitals stable, silicone dressing clean/dry/intact under soft collar brace.  Wife at bedside.  Questions answered.  Patient voiced concern of having both seroquel and ambien this PM.  On call provider paged as ambien not on MAR.  Patient c/o pain, unable to give pain meds at this time, requesting other pain medication. Continue to monitor.    Ambien 10mg  PRN ordered as well as dilaudid 0.5-1mg  q3h for pain.

## 2017-02-24 NOTE — Transfer of Care (Signed)
Immediate Anesthesia Transfer of Care Note  Patient: [name]  Procedure(s) Performed: Procedure(s) with comments: CERVICAL ANTERIOR DISC ARTHROPLASTY [name] (N/A) - 3 hrs  Patient Location: PACU  Anesthesia Type:General  Level of Consciousness: awake, alert  and patient cooperative  Airway & Oxygen Therapy: Patient Spontanous Breathing and Patient connected to face mask oxygen  Post-op Assessment: Report given to RN, Post -op Vital signs reviewed and stable, Patient moving all extremities X 4 and Patient able to stick tongue midline  Post vital signs: Reviewed and stable  Last Vitals:  Vitals:   02/24/17 1645 02/24/17 1646  BP:  (!) 146/80  Pulse: 100 (!) 101  Resp:  16  Temp: 36.5 C     Last Pain:  Vitals:   02/24/17 1230  TempSrc: Oral  PainSc: 6       Patients Stated Pain Goal: 4 (02/24/17 1230)  Complications: No apparent anesthesia complications
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
## 2017-02-24 NOTE — Anesthesia Preprocedure Evaluation (Signed)
Anesthesia Evaluation  Patient identified by MRN, date of birth, ID band Patient awake    Reviewed: Allergy & Precautions, NPO status , Patient's Chart, lab work & pertinent test results  Airway Mallampati: II  TM Distance: >3 FB Neck ROM: Full    Dental no notable dental hx. (+) Teeth Intact   Pulmonary neg pulmonary ROS,    Pulmonary exam normal breath sounds clear to auscultation       Cardiovascular hypertension, Pt. on medications Normal cardiovascular exam Rhythm:Regular Rate:Normal     Neuro/Psych  Headaches, Anxiety PTSD   GI/Hepatic negative GI ROS, Mildly elevated LFT's   Endo/Other  Hypercholesterolemia Low testosterone syndrome  Renal/GU negative Renal ROS  negative genitourinary   Musculoskeletal HNP [name] with C7 radiculopathy   Abdominal (+) + obese,   Peds  Hematology negative hematology ROS (+)   Anesthesia Other Findings   Reproductive/Obstetrics                             Anesthesia Physical Anesthesia Plan  ASA: II  Anesthesia Plan: General   Post-op Pain Management:    Induction: Intravenous  Airway Management Planned: Oral ETT  Additional Equipment:   Intra-op Plan:   Post-operative Plan: Extubation in OR  Informed Consent: I have reviewed the patients History and Physical, chart, labs and discussed the procedure including the risks, benefits and alternatives for the proposed anesthesia with the patient or authorized representative who has indicated his/her understanding and acceptance.   Dental advisory given  Plan Discussed with: CRNA, Anesthesiologist and Surgeon  Anesthesia Plan Comments:         Anesthesia Quick Evaluation

## 2017-02-25 ENCOUNTER — Encounter (HOSPITAL_COMMUNITY): Payer: Self-pay | Admitting: Orthopedic Surgery

## 2017-02-25 DIAGNOSIS — M50123 Cervical disc disorder at C6-C7 level with radiculopathy: Secondary | ICD-10-CM | POA: Diagnosis not present

## 2017-02-25 LAB — ABO/RH: ABO/RH(D): O POS

## 2017-02-25 NOTE — Progress Notes (Signed)
    Subjective: Procedure(s) (LRB): CERVICAL ANTERIOR DISC ARTHROPLASTY [name] (N/A) 1 Day Post-Op  Patient reports pain as 2 on 0-10 scale.  Reports none arm pain reports incisional neck pain   Positive void Negative bowel movement Positive flatus Negative chest pain or shortness of breath  Objective: Vital signs in last 24 hours: Temp:  [97.7 F (36.5 C)-98.6 F (37 C)] 98.4 F (36.9 C) (05/24 0525) Pulse Rate:  [87-114] 102 (05/24 0525) Resp:  [16-26] 20 (05/24 0525) BP: (115-178)/(59-82) 115/62 (05/24 0525) SpO2:  [91 %-100 %] 93 % (05/24 0525) Weight:  [106.6 kg (235 lb)-108.2 kg (238 lb 8.6 oz)] 108.2 kg (238 lb 8.6 oz) (05/23 2016)  Intake/Output from previous day: 05/23 0701 - 05/24 0700 In: 3993.3 [I.V.:3293.3; IV Piggyback:700] Out: 1350 [Urine:1300; Blood:50]  Labs:  Recent Labs  02/24/17 1213  WBC 9.0  RBC 5.81  HCT 51.6  PLT 222    Recent Labs  02/24/17 1213  NA 140  K 4.0  CL 108  CO2 24  BUN 11  CREATININE 1.32*  GLUCOSE 82  CALCIUM 9.1   No results for input(s): LABPT, INR in the last 72 hours.  Physical Exam: Neurologically intact ABD soft Intact pulses distally Incision: dressing C/D/I Compartment soft no swelling, difficulty breathing/swallowing  Assessment/Plan: Patient stable  xrays satisfactory Mobilization with physical therapy Encourage incentive spirometry Continue care  Advance diet Up with therapy  Ok for d/c to home today Instruction provided  Venita Lickahari Ronnett Pullin, MD [facility] [phone]
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
## 2017-02-25 NOTE — Progress Notes (Signed)
Patient is discharged home. Discharge instructions were given to patient and family 

## 2017-02-25 NOTE — Care Management Note (Signed)
Case Management Note  Patient Details  Name: [name] MRN: [id number] Date of Birth: 03/27/1971  Subjective/Objective:                    Action/Plan: Pt discharging home with self care. No f/u per PT/OT and no DME needs. Pt states his Workmans Comp is aware of admission and surgery. Patient has transportation home.   Expected Discharge Date:  02/25/17               Expected Discharge Plan:  Home/Self Care  In-House Referral:     Discharge planning Services     Post Acute Care Choice:    Choice offered to:     DME Arranged:    DME Agency:     HH Arranged:    HH Agency:     Status of Service:  Completed, signed off  If discussed at [employer] Length of Stay Meetings, dates discussed:    Additional Comments:  Kermit BaloKelli F Jkai Arwood, RN 02/25/2017, 11:26 AM

## 2017-02-25 NOTE — Progress Notes (Signed)
Patient slept well throughout night, c/o pain in neck continued with some relief from medication and repositioning.  Patient c/o muscle spasms in shoulder muscles, robaxin given.  No problems with appetite/nausea.  Voiding well.  Ambulated this AM approximately 40 yards standby assist, no complaints. Continue to monitor patient.
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
## 2017-03-01 NOTE — Progress Notes (Signed)
Late entry for missed G-code for OT evaluation 02/25/17.    02/25/17 0900  OT G-codes **NOT FOR INPATIENT CLASS**  Functional Assessment Tool Used Clinical judgement  Functional Limitation Self care  Self Care Current Status ([id number](G8987) CJ  Self Care Goal Status (X3244(G8988) CJ  Self Care Discharge Status [phone](G8989) CJ   Thank you!  Doristine Sectionharity A Josha Weekley, MS OTR/L  Pager: [phone]

## 2017-03-10 NOTE — Discharge Summary (Signed)
Physician Discharge Summary  Patient ID: [name] MRN: [id number] DOB/AGE: Sep 22, 1971 46 y.o.  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 Anxiety   . Elevated liver enzymes   . H/O degenerative disc disease   . H/O laminectomy    lumbar, 2009  . Herniated disc, cervical    L4,L5  . Herniation of cervical intervertebral disc with radiculopathy   . High cholesterol   . Hypercholesteremia   . Hypertension   . Hypogonadism, male   . Insomnia    PTSD  . Low testosterone   . Migraine    once monthly    Surgeries: Procedure(s): CERVICAL ANTERIOR DISC ARTHROPLASTY [name] on 02/24/2017   Consultants (if any):   Discharged Condition: Improved  Hospital Course: [name] is an 46 y.o. male who was admitted 02/24/2017 with a diagnosis of Cervical spondylitic radiculopathy and went to the operating room on 02/24/2017 and underwent the above named procedures. Post op day 1 pt reports low level of pain controlled on medication.  Pt is ambulating in hall.  Pt denies arm pain.  Pt urinating w/o difficulty.  Pt cleared by PT for DC. Pt denies nausea.   He was given perioperative antibiotics:  Anti-infectives    Start     Dose/Rate Route Frequency Ordered Stop   02/24/17 2100  ceFAZolin (ANCEF) IVPB 2g/100 mL premix     2 g 200 mL/hr over 30 Minutes Intravenous Every 8 hours 02/24/17 2005 02/25/17 0552   02/24/17 1147  ceFAZolin (ANCEF) IVPB 2g/100 mL premix     2 g 200 mL/hr over 30 Minutes Intravenous 30 min pre-op 02/24/17 1147 02/24/17 1334    .  He was given sequential compression devices, early ambulation, and TED for DVT prophylaxis.  He benefited maximally from the hospital stay and there were no complications.    Recent vital signs:  Vitals:   02/25/17 0525 02/25/17 0941   BP: 115/62 127/77  Pulse: (!) 102 95  Resp: 20 20  Temp: 98.4 F (36.9 C) 98.3 F (36.8 C)    Recent laboratory studies:  Lab Results  Component Value Date   HGB 16.9 02/24/2017   HGB 16.3 08/21/2012   HGB 16.1 12/12/2007   Lab Results  Component Value Date   WBC 9.0 02/24/2017   PLT 222 02/24/2017   No results found for: INR Lab Results  Component Value Date   NA 140 02/24/2017   K 4.0 02/24/2017   CL 108 02/24/2017   CO2 24 02/24/2017   BUN 11 02/24/2017   CREATININE 1.32 (H) 02/24/2017   GLUCOSE 82 02/24/2017    Discharge Medications:   Allergies as of 02/25/2017      Reactions   Losartan Shortness Of Breath, Palpitations      Medication List    STOP taking these medications   diazepam 5 MG tablet Commonly known as:  VALIUM   zolpidem 10 MG tablet Commonly known as:  AMBIEN     TAKE these medications   amLODipine 10 MG tablet Commonly known as:  NORVASC Take 10 mg by mouth at bedtime.   hydrochlorothiazide 25 MG tablet Commonly known as:  HYDRODIURIL Take 25 mg by mouth daily.   lamoTRIgine 200 MG tablet Commonly known as:  LAMICTAL Take 200 mg by mouth daily.   methocarbamol  500 MG tablet Commonly known as:  ROBAXIN Take 1 tablet (500 mg total) by mouth 3 (three) times daily as needed for muscle spasms.   ondansetron 4 MG tablet Commonly known as:  ZOFRAN Take 1 tablet (4 mg total) by mouth every 8 (eight) hours as needed for nausea or vomiting.   oxyCODONE-acetaminophen 10-325 MG tablet Commonly known as:  PERCOCET Take 1 tablet by mouth every 4 (four) hours as needed for pain.   QUEtiapine 200 MG tablet Commonly known as:  SEROQUEL Take 200 mg by mouth at bedtime.   rosuvastatin 5 MG tablet Commonly known as:  CRESTOR Take 5 mg by mouth daily with supper.   testosterone cypionate 100 MG/ML injection Commonly known as:  DEPOTESTOTERONE CYPIONATE Inject 100 mg into the muscle every 7 (seven) days. Takes on Fridays. For IM use  only.       Diagnostic Studies: Dg Cervical Spine 2 Or 3 Views  Result Date: 02/24/2017 CLINICAL DATA:  Status post anterior cervical arthroplasty. Initial encounter. EXAM: CERVICAL SPINE - 2-3 VIEW COMPARISON:  Intraoperative images of the cervical spine performed 02/24/2017 FINDINGS: Anterior cervical spinal fusion hardware is noted at C6-C7. The cross-table lateral view is markedly suboptimal due to limitations in positioning. There is no evidence of acute fracture or subluxation along the cervical spine. The visualized lung bases are grossly clear. IMPRESSION: Status post anterior cervical spinal fusion at C6-C7. Electronically Signed   By: Roanna RaiderJeffery  Chang M.D.   On: 02/24/2017 19:35   Dg Cervical Spine 2-3 Views  Result Date: 02/24/2017 CLINICAL DATA:  Cervical disc disease. EXAM: DG C-ARM GT 120 MIN; CERVICAL SPINE - 2-3 VIEW FLUOROSCOPY TIME:  Fluoroscopy Time:  1 minutes 6 seconds COMPARISON:  Radiographs dated 03/04/2016 FINDINGS: AP and lateral C-arm images demonstrate the patient has undergone total disc arthroplasty at [name]. IMPRESSION: Total disc arthroplasty at [name]. Electronically Signed   By: Francene BoyersJames  Maxwell M.D.   On: 02/24/2017 16:32   Dg C-arm Gt 120 Min  Result Date: 02/24/2017 CLINICAL DATA:  Cervical disc disease. EXAM: DG C-ARM GT 120 MIN; CERVICAL SPINE - 2-3 VIEW FLUOROSCOPY TIME:  Fluoroscopy Time:  1 minutes 6 seconds COMPARISON:  Radiographs dated 03/04/2016 FINDINGS: AP and lateral C-arm images demonstrate the patient has undergone total disc arthroplasty at [name]. IMPRESSION: Total disc arthroplasty at [name]. Electronically Signed   By: Francene BoyersJames  Maxwell M.D.   On: 02/24/2017 16:32    Disposition: 01-Home or Self Care Pt will report to clinic in 2 weeks Pt will continue to wear soft collar Post op medication provided for pt  Discharge Instructions    Incentive spirometry RT    Complete by:  As directed       Follow-up Information    [name], [name], MD. Schedule an  appointment as soon as possible for a visit in 2 weeks.   Specialty:  Orthopedic Surgery Why:  If symptoms worsen, For suture removal, For wound re-check Contact information: [address] [place] [address] [phone]            Signed: Kirt BoysMayo, Carmen Christina 03/10/2017, 1:07 PM

## 2017-10-19 DIAGNOSIS — M5030 Other cervical disc degeneration, unspecified cervical region: Secondary | ICD-10-CM | POA: Insufficient documentation

## 2017-10-19 DIAGNOSIS — M961 Postlaminectomy syndrome, not elsewhere classified: Secondary | ICD-10-CM | POA: Insufficient documentation

## 2018-09-19 DIAGNOSIS — F410 Panic disorder [episodic paroxysmal anxiety] without agoraphobia: Secondary | ICD-10-CM | POA: Diagnosis not present

## 2018-09-19 DIAGNOSIS — F411 Generalized anxiety disorder: Secondary | ICD-10-CM | POA: Diagnosis not present

## 2018-10-13 DIAGNOSIS — M47812 Spondylosis without myelopathy or radiculopathy, cervical region: Secondary | ICD-10-CM | POA: Diagnosis not present

## 2018-10-31 DIAGNOSIS — M5030 Other cervical disc degeneration, unspecified cervical region: Secondary | ICD-10-CM | POA: Diagnosis not present

## 2018-11-09 DIAGNOSIS — M5412 Radiculopathy, cervical region: Secondary | ICD-10-CM | POA: Diagnosis not present

## 2018-11-10 DIAGNOSIS — E291 Testicular hypofunction: Secondary | ICD-10-CM | POA: Diagnosis not present

## 2018-11-10 DIAGNOSIS — Z125 Encounter for screening for malignant neoplasm of prostate: Secondary | ICD-10-CM | POA: Diagnosis not present

## 2018-11-17 DIAGNOSIS — R6882 Decreased libido: Secondary | ICD-10-CM | POA: Diagnosis not present

## 2018-11-17 DIAGNOSIS — N5201 Erectile dysfunction due to arterial insufficiency: Secondary | ICD-10-CM | POA: Diagnosis not present

## 2018-11-17 DIAGNOSIS — E291 Testicular hypofunction: Secondary | ICD-10-CM | POA: Diagnosis not present

## 2018-11-23 DIAGNOSIS — F410 Panic disorder [episodic paroxysmal anxiety] without agoraphobia: Secondary | ICD-10-CM | POA: Diagnosis not present

## 2018-11-23 DIAGNOSIS — F411 Generalized anxiety disorder: Secondary | ICD-10-CM | POA: Diagnosis not present

## 2018-12-07 DIAGNOSIS — G894 Chronic pain syndrome: Secondary | ICD-10-CM | POA: Diagnosis not present

## 2018-12-07 DIAGNOSIS — M47812 Spondylosis without myelopathy or radiculopathy, cervical region: Secondary | ICD-10-CM | POA: Diagnosis not present

## 2018-12-07 DIAGNOSIS — M961 Postlaminectomy syndrome, not elsewhere classified: Secondary | ICD-10-CM | POA: Diagnosis not present

## 2018-12-07 DIAGNOSIS — M5030 Other cervical disc degeneration, unspecified cervical region: Secondary | ICD-10-CM | POA: Diagnosis not present

## 2018-12-13 ENCOUNTER — Institutional Professional Consult (permissible substitution): Payer: Self-pay | Admitting: Neurology

## 2019-01-09 DIAGNOSIS — G4700 Insomnia, unspecified: Secondary | ICD-10-CM | POA: Diagnosis not present

## 2019-01-09 DIAGNOSIS — M542 Cervicalgia: Secondary | ICD-10-CM | POA: Diagnosis not present

## 2019-01-09 DIAGNOSIS — I10 Essential (primary) hypertension: Secondary | ICD-10-CM | POA: Diagnosis not present

## 2019-01-09 DIAGNOSIS — E782 Mixed hyperlipidemia: Secondary | ICD-10-CM | POA: Diagnosis not present

## 2019-02-09 DIAGNOSIS — Z125 Encounter for screening for malignant neoplasm of prostate: Secondary | ICD-10-CM | POA: Diagnosis not present

## 2019-02-09 DIAGNOSIS — E291 Testicular hypofunction: Secondary | ICD-10-CM | POA: Diagnosis not present

## 2019-02-13 DIAGNOSIS — E782 Mixed hyperlipidemia: Secondary | ICD-10-CM | POA: Diagnosis not present

## 2019-02-15 ENCOUNTER — Telehealth: Payer: Self-pay | Admitting: Neurology

## 2019-02-15 NOTE — Telephone Encounter (Signed)
Pt in to r/s his appt for his sleep consult, pts appt has been r/s to 02/21/2019 @ 3pm, pt gives consent for Video Visit and also consent to bill his [employer]   Crisoforo [place] has been sent to pt  [email or web address]  [email or web address]

## 2019-02-16 DIAGNOSIS — N5201 Erectile dysfunction due to arterial insufficiency: Secondary | ICD-10-CM | POA: Diagnosis not present

## 2019-02-16 DIAGNOSIS — E291 Testicular hypofunction: Secondary | ICD-10-CM | POA: Diagnosis not present

## 2019-02-21 ENCOUNTER — Encounter: Payer: Self-pay | Admitting: Neurology

## 2019-02-21 ENCOUNTER — Institutional Professional Consult (permissible substitution): Payer: Self-pay | Admitting: Neurology

## 2019-02-21 NOTE — Telephone Encounter (Signed)
Called the patient to review their chart and made sure that everything was up to date. Patient informed they received the e-mail/text message for the visit. Instructed to make sure they hold on to the e-mail/text for the upcoming appointment as it is necessary to access their appointment. Instructed the patient that apx 30 min prior to the appointment the front staff will contact them to make sure they are ready to go for their appointment in case there is any need for troubleshooting it can be completed prior to the appointment time. Reminded the patient once more that this is treated as a Office visit and the patient must be prepared for the visit and ready at the time of their appointment preferably in a well lit area where they have good connection for the visit. Pt verbalized understanding.    Sitting and reading:1 Watching TV:0 Sitting inactive in a public place (ex. Theater or meeting):0 As a passenger in a car for an hour without a break:1 Lying down to rest in the afternoon:0 Sitting and talking to someone:0 Sitting quietly after lunch (no alcohol):0 In a car, while stopped in traffic:0 Total:2

## 2019-02-22 ENCOUNTER — Other Ambulatory Visit: Payer: Self-pay

## 2019-02-22 ENCOUNTER — Encounter: Payer: Self-pay | Admitting: Neurology

## 2019-02-22 ENCOUNTER — Ambulatory Visit (INDEPENDENT_AMBULATORY_CARE_PROVIDER_SITE_OTHER): Payer: BLUE CROSS/BLUE SHIELD | Admitting: Neurology

## 2019-02-22 DIAGNOSIS — F410 Panic disorder [episodic paroxysmal anxiety] without agoraphobia: Secondary | ICD-10-CM | POA: Diagnosis not present

## 2019-02-22 DIAGNOSIS — M542 Cervicalgia: Secondary | ICD-10-CM | POA: Diagnosis not present

## 2019-02-22 DIAGNOSIS — G4700 Insomnia, unspecified: Secondary | ICD-10-CM | POA: Diagnosis not present

## 2019-02-22 DIAGNOSIS — R0683 Snoring: Secondary | ICD-10-CM | POA: Diagnosis not present

## 2019-02-22 MED ORDER — SERTRALINE HCL 25 MG PO TABS: 25.0000 mg | ORAL_TABLET | Freq: Every day | ORAL | 5 refills | Status: DC
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
## 2019-02-22 NOTE — Patient Instructions (Signed)
Please remember to try to maintain good sleep hygiene, which means: Keep a regular sleep and wake schedule, try not to exercise or have a meal within 2 hours of your bedtime, try to keep your bedroom conducive for sleep, that is, cool and dark, without light distractors such as an illuminated alarm clock, and refrain from watching TV right before sleep or in the middle of the night and do not keep the TV or radio on during the night. Also, try not to use or play on electronic devices at bedtime, such as your cell phone, tablet PC or laptop. If you like to read at bedtime on an electronic device, try to dim the background light as much as possible. Do not eat in the middle of the night.   We will request a HST sleep study.    We will look for  snoring or sleep apnea.   For chronic insomnia, you are best followed by a psychiatrist and/or sleep psychologist.

## 2019-02-23 ENCOUNTER — Telehealth: Payer: Self-pay | Admitting: Neurology

## 2019-02-23 DIAGNOSIS — F410 Panic disorder [episodic paroxysmal anxiety] without agoraphobia: Secondary | ICD-10-CM | POA: Diagnosis not present

## 2019-02-23 DIAGNOSIS — F411 Generalized anxiety disorder: Secondary | ICD-10-CM | POA: Diagnosis not present

## 2019-02-23 NOTE — Telephone Encounter (Signed)
Pt is asking for a call from Dr Vickey Huger to discuss the QUEtiapine (SEROQUEL) 200 MG tablet;diazepam (VALIUM) 5 MG tablet & olpidem (AMBIEN) 10 MG tablet.  Pt states what he was told by Dr Dohmeier vrs the other Dr he was told to consult with conflict with each other.  Pt is asking for a call from Dr Vickey Huger

## 2019-02-23 NOTE — Telephone Encounter (Signed)
Left a  Jones yesterday so we can discuss first what can be done. I have not received a call back. I will try today again.  CD

## 2019-02-27 ENCOUNTER — Encounter: Payer: Self-pay | Admitting: Neurology

## 2019-02-28 ENCOUNTER — Telehealth: Payer: Self-pay | Admitting: Neurology

## 2019-02-28 NOTE — Telephone Encounter (Signed)
I spoke to [name], NP his psychiatry provider. We bother agree to reduce his Seroquel by 50% and keep on diazepam, up to 5 mg tid.  No need for Ambien as he has reported it no longer works. He may feel less anxious by taking a half dose here , too. After 8 days, d/c Seroquel and D/c Ambien.  NP Yetta Barre will start him on an SSRI>   He needs referral to Biofeedback with Dr. Delsa Grana.   His psychiatry provider and I have communicated in detail- this is not negotiable.

## 2019-02-28 NOTE — Telephone Encounter (Signed)
Pt has called to inform that 1 of his other doctors has recently prescribed him Trazodone, he had no idea that dr Dohmeier would be calling in Zoloft for him.  Pt is asking if he should take the Zoloft in the a.m. and the  Trazodone at night.  Please call to advise °

## 2019-02-28 NOTE — Telephone Encounter (Signed)
Please see telephone note on 02/23/2019 for further information.

## 2019-02-28 NOTE — Telephone Encounter (Signed)
I spoke to the patient and he needs clarification on multiple medications.  He is currently still taking diazepam 5mg , BID PRN, Seroquel 200mg  at [facility], Trazodone 50mg  at [facility], Ambien 10mg  at [facility].  He has not started Zoloft 25mg  yet.  He states Dr. Yetta Barre instructed him to continue the medications above and told him he could start the Zoloft but to take it in the morning.  He thought the plan was to stop Ambien and Seroquel then start Lunesta an Zoloft.  He does not feel Trazodone 50mg  at bedtime is helpful.  He is going to hold off starting the Zoloft for now.  He would like a call back to get clarification on which medications he should be taking and when.  He understands Dr. Vickey Huger is out of the office this week and will continue taking his current medications, as prescribed, until he gets a return call from our office with further direction.

## 2019-02-28 NOTE — Telephone Encounter (Signed)
Pt has called to inform that 1 of his other doctors has recently prescribed him Trazodone, he had no idea that dr Dohmeier would be calling in Zoloft for him.  Pt is asking if he should take the Zoloft in the a.m. and the  Trazodone at night.  Please call to advise

## 2019-03-01 ENCOUNTER — Encounter: Payer: Self-pay | Admitting: Neurology

## 2019-03-01 NOTE — Telephone Encounter (Signed)
I will have our research tam look at this, Thank you, Melvyn Novas, MD

## 2019-03-01 NOTE — Telephone Encounter (Signed)
Hello Dr Vickey Huger     Here is the second email they sent to me. Please le t me know the next step to take. Thank you.    FYI      From: [employer] [facility] < [email or web address]>  Sent: Friday, Feb 24, 2019 12:06 AM  To: [S-Mailbox] Clinical Trials Disclosure < [email or web address]>  Subject: [facility]: Clinical Trial Referral        Dear Clinical Research Professional,    A [email or web address] visitor has expressed interest in the following trial being conducted at your site:    Study to Assess the Long Term Safety and Tolerability of [phone] in Adult and Elderly Subjects Suffering From Difficulties to Sleep    The visitor selected the following study location: [facility] [place] - [place], [place]    Contact Details:    Name : Jaquavian Wencl    Email : [email or web address]    Phone : [phone]        Thank you,     Hi, Mr. Arnwine- I am off this week. This Trial is conducted where? I am not familiar with [email or web address]  Melvyn Novas, MD

## 2019-03-07 ENCOUNTER — Encounter: Payer: Self-pay | Admitting: Neurology

## 2019-03-07 NOTE — Telephone Encounter (Signed)
Lets go to 50% Seroquel dose, d/c Ambien .  start SSRI ( that's lexapro) and diazepam to be increased form 5 mg bid to 5 in AM and 10 In PM.

## 2019-03-07 NOTE — Telephone Encounter (Signed)
Lets talk tomorrow by phone , about 5 PM?

## 2019-03-08 MED ORDER — LORAZEPAM 1 MG PO TABS
1.0000 mg | ORAL_TABLET | Freq: Three times a day (TID) | ORAL | 0 refills | Status: DC
Start: 2019-03-08 — End: 2019-03-09

## 2019-03-08 NOTE — Telephone Encounter (Signed)
Tried up to 50 mg trazodone and couldn't help him to sleep.  D/c Seroquel, after 8 days on 50% dose, stop all .  Can "cold [place]"  d/c ambien-   Diazepam 10 mg at night. Its an anti panic attack medication.has taken it since 2014.   Zoloft 25 mg in AM- if tolerated increase to 50 mg. Takes 14 days to work.    [facility] at [facility]- call in ativan 1 mg for 14 days.   Dr Rogelio Seen has seen him at [facility]- but biofeed back has never been tried.   I encouraged the research trial participation.

## 2019-03-09 ENCOUNTER — Telehealth: Payer: Self-pay | Admitting: Neurology

## 2019-03-09 ENCOUNTER — Other Ambulatory Visit: Payer: Self-pay | Admitting: Neurology

## 2019-03-09 MED ORDER — LORAZEPAM 1 MG PO TABS: 1.0000 mg | ORAL_TABLET | Freq: Every day | ORAL | 0 refills | Status: DC

## 2019-03-09 NOTE — Telephone Encounter (Signed)
The script was meant to be one tablet at bedtime for 14 days with no refills. I have corrected this and will send to the pharmacy for the patient. I attempted to call the pharmacy, no answer.

## 2019-03-09 NOTE — Telephone Encounter (Signed)
Kelly from [facility] called needing to ask a question on a prescription they received on the pt's LORazepam (ATIVAN) 1 MG tablet and a duplicate therapy they received. Please advise.

## 2019-03-15 NOTE — Telephone Encounter (Signed)
No, you needn't do anything at this time.

## 2019-03-16 ENCOUNTER — Encounter: Payer: Self-pay | Admitting: Neurology

## 2019-03-20 ENCOUNTER — Encounter: Payer: Self-pay | Admitting: Neurology

## 2019-03-20 MED ORDER — DIAZEPAM 5 MG PO TABS: 5.0000 mg | ORAL_TABLET | Freq: Two times a day (BID) | ORAL | 1 refills | Status: DC | PRN

## 2019-03-27 ENCOUNTER — Ambulatory Visit (INDEPENDENT_AMBULATORY_CARE_PROVIDER_SITE_OTHER): Payer: BC Managed Care – PPO | Admitting: Neurology

## 2019-03-27 DIAGNOSIS — F410 Panic disorder [episodic paroxysmal anxiety] without agoraphobia: Secondary | ICD-10-CM

## 2019-03-27 DIAGNOSIS — G4733 Obstructive sleep apnea (adult) (pediatric): Secondary | ICD-10-CM

## 2019-03-27 DIAGNOSIS — M542 Cervicalgia: Secondary | ICD-10-CM

## 2019-03-27 DIAGNOSIS — G4700 Insomnia, unspecified: Secondary | ICD-10-CM

## 2019-03-27 DIAGNOSIS — R0683 Snoring: Secondary | ICD-10-CM

## 2019-03-28 ENCOUNTER — Encounter: Payer: Self-pay | Admitting: Neurology

## 2019-03-28 MED ORDER — SERTRALINE HCL 50 MG PO TABS: 50.0000 mg | ORAL_TABLET | Freq: Every day | ORAL | 5 refills | Status: DC

## 2019-03-30 ENCOUNTER — Encounter: Payer: Self-pay | Admitting: Neurology
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
## 2019-04-03 ENCOUNTER — Encounter: Payer: Self-pay | Admitting: Neurology

## 2019-04-04 NOTE — Telephone Encounter (Signed)
  Hello Mr [name]; I got your message - if you are not a CPAP candidate , do you want to try a dental device- its not as effective for APNEA  - but reduces snoring and this may be appreciated by Mrs. Loletha Grayer.

## 2019-04-05 DIAGNOSIS — M542 Cervicalgia: Secondary | ICD-10-CM | POA: Diagnosis not present

## 2019-04-13 ENCOUNTER — Encounter: Payer: Self-pay | Admitting: Neurology

## 2019-04-13 MED ORDER — ZALEPLON 10 MG PO CAPS: 10.0000 mg | ORAL_CAPSULE | Freq: Every evening | ORAL | 0 refills | Status: DC | PRN

## 2019-04-13 NOTE — Telephone Encounter (Signed)
I will allow 15 sonata for sleep induction- no refills.

## 2019-04-17 ENCOUNTER — Encounter: Payer: Self-pay | Admitting: Neurology

## 2019-04-18 NOTE — Telephone Encounter (Signed)
Your type of insomnia has been intractable- I am seeing no other chance by ut to refer to a tertiary care center/ university . I am very sorry that neither plan A nor B had any positive effect.

## 2019-04-20 ENCOUNTER — Other Ambulatory Visit: Payer: Self-pay | Admitting: Neurology

## 2019-04-20 DIAGNOSIS — R0683 Snoring: Secondary | ICD-10-CM

## 2019-04-20 DIAGNOSIS — G4700 Insomnia, unspecified: Secondary | ICD-10-CM

## 2019-04-20 DIAGNOSIS — F410 Panic disorder [episodic paroxysmal anxiety] without agoraphobia: Secondary | ICD-10-CM

## 2019-05-04 ENCOUNTER — Encounter: Payer: Self-pay | Admitting: Neurology

## 2019-05-04 ENCOUNTER — Other Ambulatory Visit: Payer: Self-pay | Admitting: Neurology

## 2019-05-04 MED ORDER — DIAZEPAM 5 MG PO TABS: 5.0000 mg | ORAL_TABLET | Freq: Two times a day (BID) | ORAL | 1 refills | Status: DC | PRN

## 2019-05-16 ENCOUNTER — Encounter: Payer: Self-pay | Admitting: Neurology

## 2019-05-16 DIAGNOSIS — F5104 Psychophysiologic insomnia: Secondary | ICD-10-CM | POA: Diagnosis not present

## 2019-05-17 ENCOUNTER — Telehealth: Payer: Self-pay | Admitting: Neurology

## 2019-05-17 ENCOUNTER — Encounter: Payer: Self-pay | Admitting: Neurology

## 2019-05-17 DIAGNOSIS — F3181 Bipolar II disorder: Secondary | ICD-10-CM | POA: Diagnosis not present

## 2019-05-17 NOTE — Telephone Encounter (Signed)
Patient returned call. I was able to discuss his concerns. Per our message the patient states that the Dr Ronnald Ramp wanted to make some changes to his medication doses. Patient is inquiring that he would like to stick up Dr Dohmeier's care in relation to the treatment with his medication. He states that Dr Ronnald Ramp wants to see him every 6 weeks and it is hard to be able to follow up that frequently. He is asking if Dr Brett Fairy will continue with his medication refills. He agrees to following up for medication management with whatever Dr Brett Fairy recommends as far as if needed every 6 months. Advised that I would have to discuss this further with her as I am not sure what Dr Brett Fairy and Dr Ronnald Ramp have discussed previously. Patient appreciated the 2nd opinion with the other sleep specialist that Dr Dohmeier recommended but would prefer to continue his care here. Advised I would bring this to her attn when she returns and explain his concern to the doctor and I will call him back with her thoughts.

## 2019-05-17 NOTE — Telephone Encounter (Signed)
Pt states he has been emailing with RN Myriam Jacobson today, he is asking for a call when she becomes available

## 2019-05-17 NOTE — Telephone Encounter (Signed)
Attempted to call the patient. No answer. LVM for him to call back.

## 2019-05-22 ENCOUNTER — Encounter: Payer: Self-pay | Admitting: Neurology

## 2019-05-23 ENCOUNTER — Encounter: Payer: Self-pay | Admitting: Neurology

## 2019-05-24 ENCOUNTER — Encounter: Payer: Self-pay | Admitting: Neurology

## 2019-05-24 MED ORDER — SERTRALINE HCL 100 MG PO TABS: 100.0000 mg | ORAL_TABLET | Freq: Every day | ORAL | 5 refills | Status: DC

## 2019-06-01 ENCOUNTER — Encounter: Payer: Self-pay | Admitting: Neurology

## 2019-06-01 NOTE — Telephone Encounter (Signed)
Cognitive behaviour therapy at [facility]- there should be a referral in !

## 2019-06-05 DIAGNOSIS — E291 Testicular hypofunction: Secondary | ICD-10-CM | POA: Diagnosis not present

## 2019-06-05 DIAGNOSIS — Z125 Encounter for screening for malignant neoplasm of prostate: Secondary | ICD-10-CM | POA: Diagnosis not present

## 2019-06-06 ENCOUNTER — Encounter: Payer: Self-pay | Admitting: Neurology

## 2019-06-07 ENCOUNTER — Encounter: Payer: Self-pay | Admitting: Neurology

## 2019-06-07 ENCOUNTER — Telehealth: Payer: Self-pay | Admitting: Neurology

## 2019-06-07 NOTE — Telephone Encounter (Signed)
Pt is asking for a 90 day refill on his Diazepam.  [place] drug registry has been checked and last refill was 05/12/2019 # 90 for a 30 day supply.  Pt's refill is not due until 06/10/2019.  I have enclosed a note to the pharmacy not to fill med until 06/10/2019. Would you be agreeable to sending med in?

## 2019-06-07 NOTE — Telephone Encounter (Signed)
No refill is due before 06-11-2019

## 2019-06-08 NOTE — Telephone Encounter (Signed)
Given how rarely Zoloft seems to cause a side effect of this kind, you may reduce it by 50% - and see how you respond.   CD

## 2019-06-21 ENCOUNTER — Encounter: Payer: Self-pay | Admitting: Neurology

## 2019-07-03 ENCOUNTER — Telehealth: Payer: Self-pay | Admitting: Neurology

## 2019-07-03 ENCOUNTER — Encounter: Payer: Self-pay | Admitting: Neurology
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
## 2019-07-03 NOTE — Telephone Encounter (Signed)
I can increase Seroquel, but not diazepam. And while on Diazepam, there is a warning to not also use any sleep aid in the [place], Ambien, Lunesta family.  Let me speak to Dr. Denton Lank about any additional medication, which I like to avoid.  CD

## 2019-07-03 NOTE — Telephone Encounter (Signed)
Lamictal was well tolerated ( lamotrigine) ? That we can use. If s we change from Diazepam to another benzodiazepine, it would be ativan or clonazepam. Any history supporting these help with your sleep disorder?

## 2019-07-04 ENCOUNTER — Encounter: Payer: Self-pay | Admitting: Neurology

## 2019-07-04 ENCOUNTER — Other Ambulatory Visit: Payer: Self-pay

## 2019-07-04 ENCOUNTER — Ambulatory Visit (INDEPENDENT_AMBULATORY_CARE_PROVIDER_SITE_OTHER): Payer: BC Managed Care – PPO | Admitting: Neurology

## 2019-07-04 VITALS — BP 142/96 | HR 81 | Temp 96.9°F | Ht 70.0 in | Wt 237.0 lb

## 2019-07-04 DIAGNOSIS — F410 Panic disorder [episodic paroxysmal anxiety] without agoraphobia: Secondary | ICD-10-CM

## 2019-07-04 DIAGNOSIS — G4700 Insomnia, unspecified: Secondary | ICD-10-CM | POA: Diagnosis not present

## 2019-07-04 DIAGNOSIS — M542 Cervicalgia: Secondary | ICD-10-CM | POA: Diagnosis not present

## 2019-07-04 MED ORDER — BELSOMRA 10 MG PO TABS: 3.0000 | ORAL_TABLET | Freq: Every evening | ORAL | 0 refills | Status: DC | PRN

## 2019-07-04 MED ORDER — LORAZEPAM 1 MG PO TABS: ORAL_TABLET | ORAL | 5 refills | Status: DC
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
## 2019-07-06 ENCOUNTER — Encounter: Payer: Self-pay | Admitting: Neurology

## 2019-07-08 ENCOUNTER — Encounter: Payer: Self-pay | Admitting: Neurology

## 2019-07-09 ENCOUNTER — Encounter: Payer: Self-pay | Admitting: Neurology

## 2019-07-10 ENCOUNTER — Encounter: Payer: Self-pay | Admitting: Neurology

## 2019-07-12 ENCOUNTER — Encounter: Payer: Self-pay | Admitting: Neurology

## 2019-07-12 ENCOUNTER — Other Ambulatory Visit: Payer: Self-pay | Admitting: Neurology

## 2019-07-12 NOTE — Telephone Encounter (Signed)
Pt is requesting a call back. 
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
## 2019-07-13 ENCOUNTER — Telehealth: Payer: Self-pay | Admitting: Neurology

## 2019-07-13 MED ORDER — ALPRAZOLAM 0.5 MG PO TABS: 0.5000 mg | ORAL_TABLET | Freq: Every evening | ORAL | 0 refills | Status: DC | PRN

## 2019-07-13 NOTE — Telephone Encounter (Signed)
Pt has called to inform that he is currently at the pharmacy and would like to know if RN Myriam Jacobson can have this medication made available for him to pick up.Duanne Moron)

## 2019-07-13 NOTE — Telephone Encounter (Signed)
Faxed the script for the pt and received confirmation at 12:56

## 2019-07-17 ENCOUNTER — Other Ambulatory Visit: Payer: Self-pay | Admitting: Neurology

## 2019-07-17 ENCOUNTER — Encounter: Payer: Self-pay | Admitting: Neurology

## 2019-07-20 ENCOUNTER — Encounter: Payer: Self-pay | Admitting: Neurology

## 2019-07-26 ENCOUNTER — Encounter: Payer: Self-pay | Admitting: Neurology

## 2019-07-28 NOTE — Telephone Encounter (Signed)
This works, and we will continue to provide it. CD

## 2019-07-31 ENCOUNTER — Encounter: Payer: Self-pay | Admitting: Neurology

## 2019-08-01 ENCOUNTER — Encounter: Payer: Self-pay | Admitting: Neurology

## 2019-08-02 MED ORDER — ALPRAZOLAM ER 2 MG PO TB24: 2.0000 mg | ORAL_TABLET | ORAL | 1 refills | Status: DC

## 2019-08-02 NOTE — Telephone Encounter (Signed)
I reviewed headache side effect under Xanax, and it's rare.  One anecdotal report stated that change to XR Xanax has helped a patient to avoid headaches.  Let me try XR form of XANAX before we may go to ativan.

## 2019-08-07 ENCOUNTER — Encounter: Payer: Self-pay | Admitting: Neurology

## 2019-08-07 DIAGNOSIS — M5412 Radiculopathy, cervical region: Secondary | ICD-10-CM | POA: Diagnosis not present

## 2019-08-07 DIAGNOSIS — G894 Chronic pain syndrome: Secondary | ICD-10-CM | POA: Diagnosis not present

## 2019-08-10 MED ORDER — ALPRAZOLAM ER 2 MG PO TB24: 2.0000 mg | ORAL_TABLET | ORAL | 1 refills | Status: DC

## 2019-08-14 ENCOUNTER — Encounter: Payer: Self-pay | Admitting: Neurology
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
## 2019-08-16 ENCOUNTER — Encounter: Payer: Self-pay | Admitting: Neurology

## 2019-08-22 ENCOUNTER — Encounter: Payer: Self-pay | Admitting: Neurology

## 2019-09-05 ENCOUNTER — Encounter: Payer: Self-pay | Admitting: Neurology

## 2019-09-14 ENCOUNTER — Other Ambulatory Visit: Payer: Self-pay

## 2019-09-14 ENCOUNTER — Encounter: Payer: Self-pay | Admitting: Neurology

## 2019-09-14 ENCOUNTER — Ambulatory Visit (INDEPENDENT_AMBULATORY_CARE_PROVIDER_SITE_OTHER): Payer: BC Managed Care – PPO | Admitting: Neurology

## 2019-09-14 VITALS — BP 142/86 | HR 89 | Temp 97.9°F | Ht 70.0 in | Wt 251.0 lb

## 2019-09-14 DIAGNOSIS — F410 Panic disorder [episodic paroxysmal anxiety] without agoraphobia: Secondary | ICD-10-CM

## 2019-09-14 DIAGNOSIS — M542 Cervicalgia: Secondary | ICD-10-CM | POA: Diagnosis not present

## 2019-09-14 DIAGNOSIS — G4700 Insomnia, unspecified: Secondary | ICD-10-CM | POA: Diagnosis not present

## 2019-09-14 MED ORDER — ALPRAZOLAM ER 2 MG PO TB24: 2.0000 mg | ORAL_TABLET | ORAL | 1 refills | Status: DC

## 2019-09-14 NOTE — Patient Instructions (Signed)
Alprazolam extended-relase tablets What is this medicine? ALPRAZOLAM (al PRAY zoe lam) is a benzodiazepine. It is used to treat anxiety and panic attacks. This medicine may be used for other purposes; ask your health care provider or pharmacist if you have questions. COMMON BRAND NAME(S): Xanax XR What should I tell my health care provider before I take this medicine? They need to know if you have any of these conditions:  an alcohol or drug abuse problem  bipolar disorder, depression, psychosis or other mental health conditions  glaucoma  kidney or liver disease  lung or breathing disease  myasthenia gravis  Parkinson's disease  porphyria  seizures or a history of seizures  suicidal thoughts  an unusual or allergic reaction to alprazolam, other benzodiazepines, foods, dyes, or preservatives  pregnant or trying to get pregnant  breast-feeding How should I use this medicine? Take this medicine by mouth. Follow the directions on the prescription label. Do not cut, crush, chew or divide the tablets. Swallow the tablets whole with a drink of water. Take your medicine at regular intervals. Do not take it more often than directed. Do not stop taking except on your doctor's advice. A special MedGuide will be given to you by the pharmacist with each prescription and refill. Be sure to read this information carefully each time. Talk to your pediatrician regarding the use of this medicine in children. Special care may be needed. Overdosage: If you think you have taken too much of this medicine contact a poison control center or emergency room at once. NOTE: This medicine is only for you. Do not share this medicine with others. What if I miss a dose? If you miss a dose, take it as soon as you can. If it is almost time for your next dose, take only that dose. Do not take double or extra doses. What may interact with this medicine? Do not take this medicine with any of the following  medications:  certain antiviral medicines for HIV or AIDS like delavirdine, indinavir  certain medicines for fungal infections like ketoconazole and itraconazole  narcotic medicines for cough  sodium oxybate This medicine may also interact with the following medications:  alcohol  antihistamines for allergy, cough and cold  certain antibiotics like clarithromycin, erythromycin, isoniazid, rifampin, rifapentine, rifabutin, and troleandomycin  certain medicines for blood pressure, heart disease, irregular heart beat  certain medicines for depression, like amitriptyline, fluoxetine, sertraline  certain medicines for seizures like carbamazepine, oxcarbazepine, phenobarbital, phenytoin, primidone  cimetidine  cyclosporine  male hormones, like estrogens or progestins and birth control pills, patches, rings, or injections  general anesthetics like halothane, isoflurane, methoxyflurane, propofol  grapefruit juice  local anesthetics like lidocaine, pramoxine, tetracaine  medicines that relax muscles for surgery  narcotic medicines for pain  other antiviral medicines for HIV or AIDS  phenothiazines like chlorpromazine, mesoridazine, prochlorperazine, thioridazine This list may not describe all possible interactions. Give your health care provider a list of all the medicines, herbs, non-prescription drugs, or dietary supplements you use. Also tell them if you smoke, drink alcohol, or use illegal drugs. Some items may interact with your medicine. What should I watch for while using this medicine? Tell your doctor or health care professional if your symptoms do not start to get better or if they get worse. Do not stop taking except on your doctor's advice. You may develop a severe reaction. Your doctor will tell you how much medicine to take. You may get drowsy or dizzy. Do not drive, use  machinery, or do anything that needs mental alertness until you know how this medicine affects  you. To reduce the risk of dizzy and fainting spells, do not stand or sit up quickly, especially if you are an older patient. Alcohol may increase dizziness and drowsiness. Avoid alcoholic drinks. If you are taking another medicine that also causes drowsiness, you may have more side effects. Give your health care provider a list of all medicines you use. Your doctor will tell you how much medicine to take. Do not take more medicine than directed. Call emergency for help if you have problems breathing or unusual sleepiness. What side effects may I notice from receiving this medicine? Side effects that you should report to your doctor or health care professional as soon as possible:  allergic reactions like skin rash, itching or hives, swelling of the face, lips, or tongue  breathing problems  confusion  loss of balance or coordination  signs and symptoms of low blood pressure like dizziness; feeling faint or lightheaded, falls; unusually weak or tired  suicidal thoughts or other mood changes Side effects that usually do not require medical attention (report to your doctor or health care professional if they continue or are bothersome):  dizziness  dry mouth  nausea, vomiting  tiredness This list may not describe all possible side effects. Call your doctor for medical advice about side effects. You may report side effects to FDA at 1-800-FDA-1088. Where should I keep my medicine? Keep out of the reach of children. This medicine can be abused. Keep your medicine in a safe place to protect it from theft. Do not share this medicine with anyone. Selling or giving away this medicine is dangerous and against the law. This medicine may cause accidental overdose and death if taken by other adults, children, or pets. Mix any unused medicine with a substance like cat litter or coffee grounds. Then throw the medicine away in a sealed container like a sealed bag or a coffee can with a lid. Do not use  the medicine after the expiration date. Store at room temperature between 15 and 30 degrees C (59 and 86 degrees F). NOTE: This sheet is a summary. It may not cover all possible information. If you have questions about this medicine, talk to your doctor, pharmacist, or health care provider.  2020 Elsevier/Gold Standard (2015-06-20 13:57:39)
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
## 2019-10-17 ENCOUNTER — Encounter: Payer: Self-pay | Admitting: Neurology

## 2019-10-23 ENCOUNTER — Encounter: Payer: Self-pay | Admitting: Neurology

## 2019-11-08 DIAGNOSIS — Z20828 Contact with and (suspected) exposure to other viral communicable diseases: Secondary | ICD-10-CM | POA: Diagnosis not present

## 2019-11-10 ENCOUNTER — Encounter: Payer: Self-pay | Admitting: Neurology

## 2019-11-10 MED ORDER — ESZOPICLONE 3 MG PO TABS: 3.0000 mg | ORAL_TABLET | Freq: Every evening | ORAL | 0 refills | Status: DC | PRN

## 2019-11-10 NOTE — Telephone Encounter (Signed)
BiPAP order was processed through [facility], unless specified otherwise.   I sent 10 tab of Lunesta 3 mg for vacation related needs to local pharmacy.

## 2019-11-16 DIAGNOSIS — E782 Mixed hyperlipidemia: Secondary | ICD-10-CM | POA: Diagnosis not present

## 2019-11-16 DIAGNOSIS — I10 Essential (primary) hypertension: Secondary | ICD-10-CM | POA: Diagnosis not present

## 2019-11-16 DIAGNOSIS — M62838 Other muscle spasm: Secondary | ICD-10-CM | POA: Diagnosis not present

## 2019-11-16 DIAGNOSIS — M542 Cervicalgia: Secondary | ICD-10-CM | POA: Diagnosis not present

## 2019-11-16 DIAGNOSIS — Z0000 Encounter for general adult medical examination without abnormal findings: Secondary | ICD-10-CM | POA: Diagnosis not present

## 2019-11-16 DIAGNOSIS — Z125 Encounter for screening for malignant neoplasm of prostate: Secondary | ICD-10-CM | POA: Diagnosis not present

## 2019-11-29 DIAGNOSIS — Z981 Arthrodesis status: Secondary | ICD-10-CM | POA: Diagnosis not present

## 2019-11-29 DIAGNOSIS — Z79899 Other long term (current) drug therapy: Secondary | ICD-10-CM | POA: Diagnosis not present

## 2019-12-11 ENCOUNTER — Encounter: Payer: Self-pay | Admitting: Neurology

## 2019-12-14 NOTE — Telephone Encounter (Signed)
I am excited to hear this. GREAT !

## 2019-12-19 DIAGNOSIS — M5030 Other cervical disc degeneration, unspecified cervical region: Secondary | ICD-10-CM | POA: Diagnosis not present

## 2020-01-08 ENCOUNTER — Encounter: Payer: Self-pay | Admitting: Neurology

## 2020-01-09 ENCOUNTER — Encounter: Payer: Self-pay | Admitting: Neurology

## 2020-01-13 DIAGNOSIS — M961 Postlaminectomy syndrome, not elsewhere classified: Secondary | ICD-10-CM | POA: Diagnosis not present

## 2020-02-09 DIAGNOSIS — M5412 Radiculopathy, cervical region: Secondary | ICD-10-CM | POA: Diagnosis not present

## 2020-02-20 DIAGNOSIS — R519 Headache, unspecified: Secondary | ICD-10-CM | POA: Diagnosis not present

## 2020-02-20 DIAGNOSIS — Z20828 Contact with and (suspected) exposure to other viral communicable diseases: Secondary | ICD-10-CM | POA: Diagnosis not present

## 2020-02-20 DIAGNOSIS — R0981 Nasal congestion: Secondary | ICD-10-CM | POA: Diagnosis not present

## 2020-02-21 DIAGNOSIS — M62830 Muscle spasm of back: Secondary | ICD-10-CM | POA: Diagnosis not present

## 2020-02-21 DIAGNOSIS — M542 Cervicalgia: Secondary | ICD-10-CM | POA: Diagnosis not present

## 2020-02-21 DIAGNOSIS — M5412 Radiculopathy, cervical region: Secondary | ICD-10-CM | POA: Diagnosis not present

## 2020-02-28 ENCOUNTER — Encounter: Payer: Self-pay | Admitting: Neurology

## 2020-02-28 DIAGNOSIS — K0501 Acute gingivitis, non-plaque induced: Secondary | ICD-10-CM | POA: Diagnosis not present

## 2020-02-28 DIAGNOSIS — J029 Acute pharyngitis, unspecified: Secondary | ICD-10-CM | POA: Diagnosis not present

## 2020-02-28 MED ORDER — SERTRALINE HCL 100 MG PO TABS: 100.0000 mg | ORAL_TABLET | Freq: Every day | ORAL | 5 refills | Status: DC

## 2020-03-06 DIAGNOSIS — M47812 Spondylosis without myelopathy or radiculopathy, cervical region: Secondary | ICD-10-CM | POA: Diagnosis not present

## 2020-03-07 ENCOUNTER — Emergency Department (HOSPITAL_BASED_OUTPATIENT_CLINIC_OR_DEPARTMENT_OTHER)
Admission: EM | Admit: 2020-03-07 | Discharge: 2020-03-08 | Discharge status: Against Medical Advice | Payer: BC Managed Care – PPO | Attending: Emergency Medicine | Admitting: Emergency Medicine

## 2020-03-07 ENCOUNTER — Encounter (HOSPITAL_BASED_OUTPATIENT_CLINIC_OR_DEPARTMENT_OTHER): Payer: Self-pay | Admitting: *Deleted

## 2020-03-07 ENCOUNTER — Other Ambulatory Visit: Payer: Self-pay

## 2020-03-07 DIAGNOSIS — M542 Cervicalgia: Secondary | ICD-10-CM | POA: Diagnosis not present

## 2020-03-07 DIAGNOSIS — I10 Essential (primary) hypertension: Secondary | ICD-10-CM | POA: Diagnosis not present

## 2020-03-07 DIAGNOSIS — Z5320 Procedure and treatment not carried out because of patient's decision for unspecified reasons: Secondary | ICD-10-CM | POA: Insufficient documentation

## 2020-03-07 DIAGNOSIS — M62838 Other muscle spasm: Secondary | ICD-10-CM

## 2020-03-07 DIAGNOSIS — R519 Headache, unspecified: Secondary | ICD-10-CM | POA: Insufficient documentation

## 2020-03-07 DIAGNOSIS — Z79899 Other long term (current) drug therapy: Secondary | ICD-10-CM | POA: Insufficient documentation

## 2020-03-07 LAB — CBC WITH DIFFERENTIAL/PLATELET
Abs Immature Granulocytes: 0.09 10*3/uL — ABNORMAL HIGH (ref 0.00–0.07)
Basophils Absolute: 0.1 10*3/uL (ref 0.0–0.1)
Basophils Relative: 1 %
Eosinophils Absolute: 0.1 10*3/uL (ref 0.0–0.5)
Eosinophils Relative: 1 %
HCT: 57.2 % — ABNORMAL HIGH (ref 39.0–52.0)
Hemoglobin: 18.1 g/dL — ABNORMAL HIGH (ref 13.0–17.0)
Immature Granulocytes: 1 %
Lymphocytes Relative: 11 %
Lymphs Abs: 1.5 10*3/uL (ref 0.7–4.0)
MCH: 28.5 pg (ref 26.0–34.0)
MCHC: 31.6 g/dL (ref 30.0–36.0)
MCV: 89.9 fL (ref 80.0–100.0)
Monocytes Absolute: 1.6 10*3/uL — ABNORMAL HIGH (ref 0.1–1.0)
Monocytes Relative: 12 %
Neutro Abs: 9.9 10*3/uL — ABNORMAL HIGH (ref 1.7–7.7)
Neutrophils Relative %: 74 %
Platelets: 274 10*3/uL (ref 150–400)
RBC: 6.36 MIL/uL — ABNORMAL HIGH (ref 4.22–5.81)
RDW: 13.6 % (ref 11.5–15.5)
WBC: 13.3 10*3/uL — ABNORMAL HIGH (ref 4.0–10.5)
nRBC: 0 % (ref 0.0–0.2)

## 2020-03-07 LAB — BASIC METABOLIC PANEL
Anion gap: 12 (ref 5–15)
BUN: 20 mg/dL (ref 6–20)
CO2: 28 mmol/L (ref 22–32)
Calcium: 9.4 mg/dL (ref 8.9–10.3)
Chloride: 100 mmol/L (ref 98–111)
Creatinine, Ser: 1.2 mg/dL (ref 0.61–1.24)
GFR calc Af Amer: >60 mL/min (ref >60)
GFR calc non Af Amer: >60 mL/min (ref >60)
Glucose, Bld: 105 mg/dL — ABNORMAL HIGH (ref 70–99)
Potassium: 3.2 mmol/L — ABNORMAL LOW (ref 3.5–5.1)
Sodium: 140 mmol/L (ref 135–145)

## 2020-03-07 MED ORDER — HYDROMORPHONE HCL 1 MG/ML IJ SOLN
2.0000 mg | Freq: Once | INTRAMUSCULAR | Status: AC
  Administered 2020-03-07: 2 mg via INTRAMUSCULAR
  Filled 2020-03-07: qty 2

## 2020-03-07 MED ORDER — METHOCARBAMOL 1000 MG/10ML IJ SOLN
1000.0000 mg | Freq: Once | INTRAMUSCULAR | Status: AC
  Administered 2020-03-08: 1000 mg via INTRAVENOUS
  Filled 2020-03-07: qty 10

## 2020-03-07 MED ORDER — LORAZEPAM 2 MG/ML IJ SOLN
1.0000 mg | Freq: Once | INTRAMUSCULAR | Status: AC
  Administered 2020-03-07: 1 mg via INTRAMUSCULAR
  Filled 2020-03-07: qty 1

## 2020-03-07 MED ORDER — SODIUM CHLORIDE 0.9 % IV BOLUS (SEPSIS)
1000.0000 mL | Freq: Once | INTRAVENOUS | Status: AC
  Administered 2020-03-08: 1000 mL via INTRAVENOUS

## 2020-03-07 NOTE — ED Notes (Signed)
Pt walked down the hall and told Charge RN he needed to leave to go get his "meds".

## 2020-03-07 NOTE — ED Notes (Signed)
ED Provider at bedside. 

## 2020-03-07 NOTE — ED Notes (Signed)
Pt wife arrived and walked towards pt in [facility] with this RN. Pt started to have upper body jerking movements. Alert, eyes open, able to speak. Airway intact. Upper body flailing lasting a few seconds. Informed MD will be seeing pt soon.

## 2020-03-07 NOTE — ED Provider Notes (Signed)
TIME SEEN: 11:22 PM  CHIEF COMPLAINT: Muscle spasms  HPI: Patient is a 49 year old male with history of chronic neck pain with previous discectomy, laminectomy followed by Dr. Rolena Infante with [facility] as well as Dr. Dossie Der with pain management who presents to the emergency department with what he describes as involuntary movements of both of his shoulders that has progressively worsened today and now moving down his arms.  Has had muscle spasms in his shoulders for the past 3 weeks and has been on baclofen with good relief.  States baclofen however has not been helping.  He states he chronically has pain in the left side of his neck but started having more pain in the right side of his neck and into the back of the head.  He denies any numbness, focal weakness, bowel or bladder incontinence, urinary retention, fever.  He has been able to ambulate.  Patient reports last cervical spine surgery was in 2018.  Last epidural injection by Dr. Nelva Bush was in April.  He also reports he received his first Covid vaccination on Tuesday, June 21.  He has also been on amoxicillin for a sinus infection.  States he still feels congested but slowly improving.  Patient and wife report appointment to follow-up with the PA for Dr. Rolena Infante in the morning.  ROS: See HPI Constitutional: no fever  Eyes: no drainage  ENT: no runny nose   Cardiovascular:  no chest pain  Resp: no SOB  GI: no vomiting GU: no dysuria Integumentary: no rash  Allergy: no hives  Musculoskeletal: no leg swelling  Neurological: no slurred speech ROS otherwise negative  PAST MEDICAL HISTORY/PAST SURGICAL HISTORY:  Past Medical History:  Diagnosis Date  . Anxiety   . Elevated liver enzymes   . H/O degenerative disc disease   . H/O laminectomy    lumbar, 2009  . Herniated disc, cervical    L4,L5  . Herniation of cervical intervertebral disc with radiculopathy   . High cholesterol   . Hypercholesteremia   . Hypertension   .  Hypogonadism, male   . Insomnia    PTSD  . Low testosterone   . Migraine    once monthly    MEDICATIONS:  Prior to Admission medications   Medication Sig Start Date End Date Taking? Authorizing Provider  ALPRAZolam (XANAX XR) 2 MG 24 hr tablet Take 1 tablet (2 mg total) by mouth every morning. 09/14/19   Dohmeier, Asencion Partridge, MD  amLODipine (NORVASC) 10 MG tablet Take 10 mg by mouth at bedtime.  12/20/12   [provider]  amoxicillin-clavulanate (AUGMENTIN) 875-125 MG tablet Take 1 tablet by mouth 2 (two) times daily. 02/28/20   [provider]  baclofen (LIORESAL) 10 MG tablet Take 10 mg by mouth 3 (three) times daily. 02/29/20   [provider]  Eszopiclone (ESZOPICLONE) 3 MG TABS Take 1 tablet (3 mg total) by mouth at bedtime as needed. Take immediately before bedtime 11/10/19   Dohmeier, Asencion Partridge, MD  hydrochlorothiazide (HYDRODIURIL) 25 MG tablet Take 25 mg by mouth daily.    [provider]  HYDROcodone-acetaminophen (NORCO) 10-325 MG tablet 2 tablets 2 (two) times daily as needed. 01/27/19   [provider]  oxymorphone (OPANA) 10 MG tablet oxymorphone 10 mg tablet    [provider]  predniSONE (STERAPRED UNI-PAK 21 TAB) 10 MG (21) TBPK tablet prednisone 10 mg tablets in a dose pack    [provider]  rosuvastatin (CRESTOR) 5 MG tablet Take 5 mg by mouth daily  with supper. 01/27/17   [provider]  sertraline (ZOLOFT) 100 MG tablet Take 1 tablet (100 mg total) by mouth daily. 02/28/20   Dohmeier, Porfirio Mylar, MD  testosterone cypionate (DEPOTESTOTERONE CYPIONATE) 100 MG/ML injection Inject 100 mg into the muscle every 7 (seven) days. Takes on Fridays. For IM use only.    [provider]    ALLERGIES:  Allergies  Allergen Reactions  . Losartan Shortness Of Breath and Palpitations  . Alprazolam Other (See Comments)  . Percocet [Oxycodone-Acetaminophen] Hives    SOCIAL HISTORY:  Social History   Tobacco Use  .  Smoking status: Never Smoker  . Smokeless tobacco: Never Used  Substance Use Topics  . Alcohol use: Yes    Comment: occasionally, one glass of wine monthly/ week    FAMILY HISTORY: Family History  Problem Relation Age of Onset  . Diabetes Mother   . Hypertension Mother   . Stroke Mother   . Diabetes Father   . Migraines Sister        3 sisters    EXAM: BP (!) 144/106   Pulse (!) 116   Temp 99 F (37.2 C)   Resp 18   Ht 5\' 10"  (1.778 m)   Wt 102.5 kg   SpO2 98%   BMI 32.43 kg/m  CONSTITUTIONAL: Alert and oriented and responds appropriately to questions.  Appears uncomfortable, diaphoretic HEAD: Normocephalic EYES: Conjunctivae clear, pupils appear equal, EOM appear intact ENT: normal nose; moist mucous membranes NECK: Supple, normal ROM, tender to palpation over the right trapezius muscle and occiput without soft tissue swelling, redness, warmth, ecchymosis or deformity; no midline spinal tenderness or step-off or deformity CARD: Regular and tachycardic; S1 and S2 appreciated; no murmurs, no clicks, no rubs, no gallops RESP: Normal chest excursion without splinting or tachypnea; breath sounds clear and equal bilaterally; no wheezes, no rhonchi, no rales, no hypoxia or respiratory distress, speaking full sentences ABD/GI: Normal bowel sounds; non-distended; soft, non-tender, no rebound, no guarding, no peritoneal signs, no hepatosplenomegaly BACK:  The back appears normal EXT: Normal ROM in all joints; no deformity noted, no edema; no cyanosis SKIN: Normal color for age and race; warm; no rash on exposed skin NEURO: Moves all extremities equally, strength 5/5 in all 4 extremities, sensation to light touch intact diffusely, cranial nerves II through XII intact, gait has been normal, normal reflexes, no hyperreflexia or clonus PSYCH: The patient's mood and manner are appropriate.   MEDICAL DECISION MAKING: Patient here with what he describes as involuntary muscle movements of  his shoulders that has now moved down his arms.  He has had muscle spasms in his shoulders before but states it has never been this bad.  He has a normal neurologic exam here.  He has intermittent shaking of both of his upper extremities that can be quite violent at times but he is awake, alert and conversant during these episodes.  I do not feel that he is having a seizure.  When asked to do something intentional, the shaking will stop.  These episodes last for about 15 to 30 seconds and occur every 1 to 2 minutes if not more frequently.  He states he is having a significant amount of pain.  He has received IM Ativan by previous provider without much relief.  Will give IM Dilaudid and attempt to get his symptoms under control where we can actually place an IV safely.  Nursing staff states they have witnessed him ambulating without any difficulty here and when  he is walking he does not have any of these involuntary movements.  His labs today show mild leukocytosis which may be reactive.  He is currently afebrile.  I do not think this is meningitis or encephalitis.  This would be a bizarre presentation for epidural abscess.  He does have history of epidural injections last being in April.  He states his last cervical spine surgery was in 2018.  He does report getting his first Covid vaccine this week as well as having a sinus infection however these episodes of muscle spasms have been going on prior to this.   ED PROGRESS: Patient seems to have improved slightly with IM Dilaudid.  Nurse now able to place IV and will give IV fluids, IV Robaxin.  Unfortunately we do not have baclofen here at our stand-alone emergency department and have discussed this with patient and wife.  We will add on magnesium level as well as CK.  His potassium is slightly low at 3.2.  Will give oral replacement.  Magnesium level today is normal.  CK slightly elevated which I suspect is from all of these abnormal movements.  We will  continue to hydrate with a second liter of fluid.  Patient reports no significant provement with Robaxin although he seems more comfortable in these episodes seem less violent and less frequent.  Discussed with our neurologist Dr. Wilford Corner who recommends giving IV Valium and discussing with tele neurology who can visualize these episodes.   Patient seen by telemetry neurologist Dr. Nelva Bush.  She states that these episodes appear to be typical of cervical spasms but also likely a psychogenic component as well.  She recommends starting patient on carbamazepine 200 mg twice daily but agrees that this is not a seizure or stroke and given his reassuring exam that it is unlikely cervical myelopathy or critical cord issue at this time.  She agrees with follow-up with his neurosurgery team in the morning as scheduled.  She states that we likely will not get these episodes under control and she has discussed this with patient and his wife that it will take time for Tegretol to build up in his system.  Discussed this with patient and wife.  Patient seems comfortable with this plan.  Plan will be to reassess him after IV Valium and Tegretol.   I was informed by nursing staff that patient's wife came out of the room stating that she could no longer wait as she had to work in the morning.  I was not informed that patient and wife were leaving.  He did sign out AGAINST MEDICAL ADVICE.  Patient's wife requested that his discharge papers be mailed to them and his prescriptions called into the pharmacy.  Given I was not able to reassess patient after Tegretol, I do not feel comfortable prescribing this prescription for him.  He has follow-up with neurosurgery in the morning and they can fill this prescription if they deem it is appropriate.   I reviewed all nursing notes and pertinent previous records as available.  I have reviewed and interpreted any EKGs, lab and urine results, imaging (as available).    CRITICAL  CARE Performed by: Rochele Raring   Total critical care time: 65 minutes  Critical care time was exclusive of separately billable procedures and treating other patients.  Critical care was necessary to treat or prevent imminent or life-threatening deterioration.  Critical care was time spent personally by me on the following activities: development of treatment plan with patient and/or  surrogate as well as nursing, discussions with consultants, evaluation of patient's response to treatment, examination of patient, obtaining history from patient or surrogate, ordering and performing treatments and interventions, ordering and review of laboratory studies, ordering and review of radiographic studies, pulse oximetry and re-evaluation of patient's condition.   [name] was evaluated in Emergency Department on 03/07/2020 for the symptoms described in the history of present illness. He was evaluated in the context of the global COVID-19 pandemic, which necessitated consideration that the patient might be at risk for infection with the SARS-CoV-2 virus that causes COVID-19. Institutional protocols and algorithms that pertain to the evaluation of patients at risk for COVID-19 are in a state of rapid change based on information released by regulatory bodies including the CDC and federal and state organizations. These policies and algorithms were followed during the patient's care in the ED.      Lizvet Chunn, Layla Maw, DO 03/08/20 [phone]

## 2020-03-07 NOTE — ED Notes (Signed)
Blood drawn in triage, increased episodes of uncontrolled spasms, MD notified and pt brought back to hallway stretcher

## 2020-03-07 NOTE — ED Notes (Signed)
Unable to get pt BP at this moment due to pt upper body shaking

## 2020-03-07 NOTE — ED Notes (Signed)
PT decided to come back in to the treatment area to be seen. Pt's wife to bring his medicines from home. Dr. Fredderick Phenix informed. Pt alert, amb. NAD.

## 2020-03-07 NOTE — ED Triage Notes (Signed)
Pt c/o upper body spasm x 1 month , c/o neck pain with hx of same , taking baclofen w/o improvement

## 2020-03-07 NOTE — ED Notes (Signed)
Pt spasms becoming worse, EDP, Belfi notified

## 2020-03-08 ENCOUNTER — Emergency Department (HOSPITAL_BASED_OUTPATIENT_CLINIC_OR_DEPARTMENT_OTHER): Payer: BC Managed Care – PPO

## 2020-03-08 DIAGNOSIS — R519 Headache, unspecified: Secondary | ICD-10-CM | POA: Diagnosis not present

## 2020-03-08 DIAGNOSIS — M542 Cervicalgia: Secondary | ICD-10-CM | POA: Diagnosis not present

## 2020-03-08 DIAGNOSIS — M62838 Other muscle spasm: Secondary | ICD-10-CM | POA: Diagnosis not present

## 2020-03-08 DIAGNOSIS — M5412 Radiculopathy, cervical region: Secondary | ICD-10-CM | POA: Diagnosis not present

## 2020-03-08 DIAGNOSIS — I10 Essential (primary) hypertension: Secondary | ICD-10-CM | POA: Diagnosis not present

## 2020-03-08 DIAGNOSIS — Z5320 Procedure and treatment not carried out because of patient's decision for unspecified reasons: Secondary | ICD-10-CM | POA: Diagnosis not present

## 2020-03-08 DIAGNOSIS — Z79899 Other long term (current) drug therapy: Secondary | ICD-10-CM | POA: Diagnosis not present

## 2020-03-08 LAB — CK: Total CK: 705 U/L — ABNORMAL HIGH (ref 49–397)

## 2020-03-08 LAB — MAGNESIUM: Magnesium: 2.4 mg/dL (ref 1.7–2.4)

## 2020-03-08 MED ORDER — CARBAMAZEPINE 200 MG PO TABS
200.0000 mg | ORAL_TABLET | Freq: Once | ORAL | Status: AC
  Administered 2020-03-08: 200 mg via ORAL
  Filled 2020-03-08: qty 1

## 2020-03-08 MED ORDER — HYDROMORPHONE HCL 1 MG/ML IJ SOLN: 1.0000 mg | Freq: Once | INTRAMUSCULAR | Status: DC

## 2020-03-08 MED ORDER — DIAZEPAM 5 MG/ML IJ SOLN
5.0000 mg | Freq: Once | INTRAMUSCULAR | Status: AC
  Administered 2020-03-08: 5 mg via INTRAVENOUS
  Filled 2020-03-08: qty 2

## 2020-03-08 MED ORDER — SODIUM CHLORIDE 0.9 % IV BOLUS (SEPSIS)
1000.0000 mL | Freq: Once | INTRAVENOUS | Status: AC
  Administered 2020-03-08: 1000 mL via INTRAVENOUS

## 2020-03-08 MED ORDER — POTASSIUM CHLORIDE CRYS ER 20 MEQ PO TBCR
40.0000 meq | EXTENDED_RELEASE_TABLET | Freq: Once | ORAL | Status: AC
  Administered 2020-03-08: 40 meq via ORAL
  Filled 2020-03-08: qty 2

## 2020-03-08 NOTE — ED Notes (Signed)
Patient up out of bed; ambulatory with no difficulty to restroom. Patient alert and oriented moving all extremities well; no twitch like movements noted while patient out of bed to restroom.

## 2020-03-08 NOTE — ED Notes (Signed)
Md notified of pt and symptoms , verbal order for cbc, cmp.
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
## 2020-03-14 ENCOUNTER — Ambulatory Visit (INDEPENDENT_AMBULATORY_CARE_PROVIDER_SITE_OTHER): Payer: BC Managed Care – PPO | Admitting: Neurology

## 2020-03-14 ENCOUNTER — Encounter: Payer: Self-pay | Admitting: Neurology

## 2020-03-14 DIAGNOSIS — M542 Cervicalgia: Secondary | ICD-10-CM

## 2020-03-14 DIAGNOSIS — G4700 Insomnia, unspecified: Secondary | ICD-10-CM

## 2020-03-14 DIAGNOSIS — G478 Other sleep disorders: Secondary | ICD-10-CM

## 2020-03-14 MED ORDER — LORAZEPAM 0.5 MG PO TABS: 0.5000 mg | ORAL_TABLET | Freq: Three times a day (TID) | ORAL | 0 refills | Status: DC

## 2020-03-14 NOTE — Patient Instructions (Signed)
Panic Attack A panic attack is a sudden episode of severe anxiety, fear, or discomfort that causes physical and emotional symptoms. The attack may be in response to something frightening, or it may occur for no known reason. Symptoms of a panic attack can be similar to symptoms of a heart attack or stroke. It is important to see your health care provider when you have a panic attack so that these conditions can be ruled out. A panic attack is a symptom of another condition. Most panic attacks go away with treatment of the underlying problem. If you have panic attacks often, you may have a condition called panic disorder. What are the causes? A panic attack may be caused by:  An extreme, life-threatening situation, such as a war or natural disaster.  An anxiety disorder, such as post-traumatic stress disorder.  Depression.  Certain medical conditions, including heart problems, neurological conditions, and infections.  Certain over-the-counter and prescription medicines.  Illegal drugs that increase heart rate and blood pressure, such as methamphetamine.  Alcohol.  Supplements that increase anxiety.  Panic disorder. What increases the risk? You are more likely to develop this condition if:  You have an anxiety disorder.  You have another mental health condition.  You take certain medicines.  You use alcohol, illegal drugs, or other substances.  You are under extreme stress.  A life event is causing increased feelings of anxiety and depression. What are the signs or symptoms? A panic attack starts suddenly, usually lasts about 20 minutes, and occurs with one or more of the following:  A pounding heart.  A feeling that your heart is beating irregularly or faster than normal (palpitations).  Sweating.  Trembling or shaking.  Shortness of breath or feeling smothered.  Feeling choked.  Chest pain or discomfort.  Nausea or a strange feeling in your  stomach.  Dizziness, feeling lightheaded, or feeling like you might faint.  Chills or hot flashes.  Numbness or tingling in your lips, hands, or feet.  Feeling confused, or feeling that you are not yourself.  Fear of losing control or being emotionally unstable.  Fear of dying. How is this diagnosed? A panic attack is diagnosed with an assessment by your health care provider. During the assessment your health care provider will ask questions about:  Your history of anxiety, depression, and panic attacks.  Your medical history.  Whether you drink alcohol, use illegal drugs, take supplements, or take medicines. Be honest about your substance use. Your health care provider may also:  Order blood tests or other kinds of tests to rule out serious medical conditions.  Refer you to a mental health professional for further evaluation. How is this treated? Treatment depends on the cause of the panic attack:  If the cause is a medical problem, your health care provider will either treat that problem or refer you to a specialist.  If the cause is emotional, you may be given anti-anxiety medicines or referred to a counselor. These medicines may reduce how often attacks happen, reduce how severe the attacks are, and lower anxiety.  If the cause is a medicine, your health care provider may tell you to stop the medicine, change your dose, or take a different medicine.  If the cause is a drug, treatment may involve letting the drug wear off and taking medicine to help the drug leave your body or to counteract its effects. Attacks caused by drug abuse may continue even if you stop using the drug. Follow these instructions   at home:  Take over-the-counter and prescription medicines only as told by your health care provider.  If you feel anxious, limit your caffeine intake.  Take good care of your physical and mental health by: ? Eating a balanced diet that includes plenty of fresh fruits and  vegetables, whole grains, lean meats, and low-fat dairy. ? Getting plenty of rest. Try to get 7-8 hours of uninterrupted sleep each night. ? Exercising regularly. Try to get 30 minutes of physical activity at least 5 days a week. ? Not smoking. Talk to your health care provider if you need help quitting. ? Limiting alcohol intake to no more than 1 drink a day for nonpregnant women and 2 drinks a day for men. One drink equals 12 oz of beer, 5 oz of wine, or 1 oz of hard liquor.  Keep all follow-up visits as told by your health care provider. This is important. Panic attacks may have underlying physical or emotional problems that take time to accurately diagnose. Contact a health care provider if:  Your symptoms do not improve, or they get worse.  You are not able to take your medicine as prescribed because of side effects. Get help right away if:  You have serious thoughts about hurting yourself or others.  You have symptoms of a panic attack. Do not drive yourself to the hospital. Have someone else drive you or call an ambulance. If you ever feel like you may hurt yourself or others, or you have thoughts about taking your own life, get help right away. You can go to your nearest emergency department or call:  Your local emergency services (911 in the [place]  A suicide crisis helpline, such as the National Suicide Prevention Lifeline at 1-800-273-8255. This is open 24 hours a day. Summary  A panic attack is a sign of a serious health or mental health condition. Get help right away. Do not drive yourself to the hospital. Have someone else drive you or call an ambulance.  Always see a health care provider to have the reasons for the panic attack correctly diagnosed.  If your panic attack was caused by a physical problem, follow your health care provider's suggestions for medicine, referral to a specialist, and lifestyle changes.  If your panic attack was caused by an emotional problem,  follow through with counseling from a qualified mental health specialist.  If you feel like you may hurt yourself or others, call 911 and get help right away. This information is not intended to replace advice given to you by your health care provider. Make sure you discuss any questions you have with your health care provider. Document Revised: 09/03/2017 Document Reviewed: 10/30/2016 [employer] Patient Education  2020 [employer]  
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
## 2020-03-18 ENCOUNTER — Encounter: Payer: Self-pay | Admitting: Neurology

## 2020-03-18 DIAGNOSIS — Z9889 Other specified postprocedural states: Secondary | ICD-10-CM | POA: Diagnosis not present

## 2020-03-18 DIAGNOSIS — Z125 Encounter for screening for malignant neoplasm of prostate: Secondary | ICD-10-CM | POA: Diagnosis not present

## 2020-03-18 DIAGNOSIS — E291 Testicular hypofunction: Secondary | ICD-10-CM | POA: Diagnosis not present

## 2020-03-19 ENCOUNTER — Encounter: Payer: Self-pay | Admitting: Neurology

## 2020-03-20 DIAGNOSIS — R040 Epistaxis: Secondary | ICD-10-CM | POA: Diagnosis not present

## 2020-03-22 DIAGNOSIS — D751 Secondary polycythemia: Secondary | ICD-10-CM | POA: Diagnosis not present

## 2020-03-22 DIAGNOSIS — E291 Testicular hypofunction: Secondary | ICD-10-CM | POA: Diagnosis not present

## 2020-03-22 DIAGNOSIS — N5201 Erectile dysfunction due to arterial insufficiency: Secondary | ICD-10-CM | POA: Diagnosis not present

## 2020-03-27 ENCOUNTER — Encounter: Payer: Self-pay | Admitting: Neurology

## 2020-03-28 ENCOUNTER — Encounter: Payer: Self-pay | Admitting: Neurology

## 2020-03-28 ENCOUNTER — Other Ambulatory Visit: Payer: Self-pay | Admitting: Neurology

## 2020-03-28 DIAGNOSIS — M961 Postlaminectomy syndrome, not elsewhere classified: Secondary | ICD-10-CM | POA: Diagnosis not present

## 2020-03-28 DIAGNOSIS — M5030 Other cervical disc degeneration, unspecified cervical region: Secondary | ICD-10-CM | POA: Diagnosis not present

## 2020-03-28 DIAGNOSIS — M47812 Spondylosis without myelopathy or radiculopathy, cervical region: Secondary | ICD-10-CM | POA: Diagnosis not present

## 2020-03-28 MED ORDER — LORAZEPAM 0.5 MG PO TABS: 0.5000 mg | ORAL_TABLET | Freq: Three times a day (TID) | ORAL | 0 refills | Status: DC

## 2020-03-28 NOTE — Telephone Encounter (Signed)
Hello Mr Strike- I am happy to fill it out, please not that I leave on 04-12-2020 for 3 weeks in [place].

## 2020-04-07 DIAGNOSIS — R202 Paresthesia of skin: Secondary | ICD-10-CM | POA: Diagnosis not present

## 2020-04-07 DIAGNOSIS — T7840XA Allergy, unspecified, initial encounter: Secondary | ICD-10-CM | POA: Diagnosis not present

## 2020-04-10 DIAGNOSIS — M791 Myalgia, unspecified site: Secondary | ICD-10-CM | POA: Diagnosis not present

## 2020-04-10 DIAGNOSIS — R202 Paresthesia of skin: Secondary | ICD-10-CM | POA: Diagnosis not present

## 2020-04-10 DIAGNOSIS — R7989 Other specified abnormal findings of blood chemistry: Secondary | ICD-10-CM | POA: Diagnosis not present

## 2020-04-10 DIAGNOSIS — R200 Anesthesia of skin: Secondary | ICD-10-CM | POA: Diagnosis not present

## 2020-04-11 ENCOUNTER — Other Ambulatory Visit: Payer: Self-pay | Admitting: Neurology

## 2020-04-11 ENCOUNTER — Encounter: Payer: Self-pay | Admitting: Neurology

## 2020-04-11 DIAGNOSIS — Z79899 Other long term (current) drug therapy: Secondary | ICD-10-CM | POA: Diagnosis not present

## 2020-04-11 DIAGNOSIS — G894 Chronic pain syndrome: Secondary | ICD-10-CM | POA: Diagnosis not present

## 2020-04-11 DIAGNOSIS — M62838 Other muscle spasm: Secondary | ICD-10-CM | POA: Diagnosis not present

## 2020-04-11 DIAGNOSIS — Z5181 Encounter for therapeutic drug level monitoring: Secondary | ICD-10-CM | POA: Diagnosis not present

## 2020-04-11 DIAGNOSIS — M961 Postlaminectomy syndrome, not elsewhere classified: Secondary | ICD-10-CM | POA: Diagnosis not present

## 2020-04-11 MED ORDER — LORAZEPAM 0.5 MG PO TABS: ORAL_TABLET | ORAL | 0 refills | Status: DC

## 2020-04-11 MED ORDER — LORAZEPAM 0.5 MG PO TABS: 0.5000 mg | ORAL_TABLET | Freq: Three times a day (TID) | ORAL | 0 refills | Status: DC

## 2020-04-11 NOTE — Addendum Note (Signed)
Addended by: Melvyn Novas on: 04/11/2020 11:00 AM   Modules accepted: Orders

## 2020-04-16 ENCOUNTER — Encounter (HOSPITAL_COMMUNITY): Payer: Self-pay

## 2020-04-16 ENCOUNTER — Emergency Department (HOSPITAL_COMMUNITY)
Admission: EM | Admit: 2020-04-16 | Discharge: 2020-04-16 | Disposition: A | Discharge status: Home / Self Care | Payer: BC Managed Care – PPO | Attending: Emergency Medicine | Admitting: Emergency Medicine

## 2020-04-16 DIAGNOSIS — R509 Fever, unspecified: Secondary | ICD-10-CM | POA: Diagnosis not present

## 2020-04-16 DIAGNOSIS — T50Z95A Adverse effect of other vaccines and biological substances, initial encounter: Secondary | ICD-10-CM | POA: Diagnosis not present

## 2020-04-16 DIAGNOSIS — I10 Essential (primary) hypertension: Secondary | ICD-10-CM | POA: Diagnosis not present

## 2020-04-16 DIAGNOSIS — T7840XA Allergy, unspecified, initial encounter: Secondary | ICD-10-CM

## 2020-04-16 DIAGNOSIS — Z79899 Other long term (current) drug therapy: Secondary | ICD-10-CM | POA: Diagnosis not present

## 2020-04-16 LAB — COMPREHENSIVE METABOLIC PANEL
ALT: 125 U/L — ABNORMAL HIGH (ref 0–44)
AST: 26 U/L (ref 15–41)
Albumin: 4.3 g/dL (ref 3.5–5.0)
Alkaline Phosphatase: 86 U/L (ref 38–126)
Anion gap: 9 (ref 5–15)
BUN: 19 mg/dL (ref 6–20)
CO2: 34 mmol/L — ABNORMAL HIGH (ref 22–32)
Calcium: 9.1 mg/dL (ref 8.9–10.3)
Chloride: 93 mmol/L — ABNORMAL LOW (ref 98–111)
Creatinine, Ser: 1.07 mg/dL (ref 0.61–1.24)
GFR calc Af Amer: >60 mL/min (ref >60)
GFR calc non Af Amer: >60 mL/min (ref >60)
Glucose, Bld: 94 mg/dL (ref 70–99)
Potassium: 3.3 mmol/L — ABNORMAL LOW (ref 3.5–5.1)
Sodium: 136 mmol/L (ref 135–145)
Total Bilirubin: 0.8 mg/dL (ref 0.3–1.2)
Total Protein: 7.8 g/dL (ref 6.5–8.1)

## 2020-04-16 LAB — SEDIMENTATION RATE: Sed Rate: 0 mm/hr (ref 0–16)

## 2020-04-16 LAB — CK: Total CK: 130 U/L (ref 49–397)

## 2020-04-16 MED ORDER — LACTATED RINGERS IV BOLUS
1000.0000 mL | Freq: Once | INTRAVENOUS | Status: AC
  Administered 2020-04-16: 1000 mL via INTRAVENOUS

## 2020-04-16 MED ORDER — METHYLPREDNISOLONE SODIUM SUCC 125 MG IJ SOLR
125.0000 mg | Freq: Once | INTRAMUSCULAR | Status: AC
  Administered 2020-04-16: 125 mg via INTRAVENOUS
  Filled 2020-04-16: qty 2

## 2020-04-16 MED ORDER — DIPHENHYDRAMINE HCL 50 MG/ML IJ SOLN
25.0000 mg | Freq: Once | INTRAMUSCULAR | Status: AC
  Administered 2020-04-16: 25 mg via INTRAVENOUS
  Filled 2020-04-16: qty 1

## 2020-04-16 MED ORDER — FAMOTIDINE IN NACL 20-0.9 MG/50ML-% IV SOLN
20.0000 mg | Freq: Once | INTRAVENOUS | Status: AC
  Administered 2020-04-16: 20 mg via INTRAVENOUS
  Filled 2020-04-16: qty 50

## 2020-04-16 MED ORDER — GABAPENTIN 300 MG PO CAPS
600.0000 mg | ORAL_CAPSULE | Freq: Once | ORAL | Status: AC
  Administered 2020-04-16: 600 mg via ORAL
  Filled 2020-04-16: qty 2

## 2020-04-16 MED ORDER — ACETAMINOPHEN 500 MG PO TABS
1000.0000 mg | ORAL_TABLET | Freq: Once | ORAL | Status: AC
  Administered 2020-04-16: 1000 mg via ORAL
  Filled 2020-04-16: qty 2

## 2020-04-16 NOTE — ED Notes (Signed)
Patient states he is having tightness in his throat, but is able to swallow and speak in complete sentences. Patient continues to have a rash on his arms.

## 2020-04-16 NOTE — ED Provider Notes (Signed)
[facility]-EMERGENCY DEPT Provider Note   CSN: [id number] Arrival date & time: 04/16/20  0254     History No chief complaint on file.   [name] is a 49 y.o. male.  Patient is a 49 year old male with a history of hypertension, hypogonadism on testosterone therapy, chronically elevated liver enzymes and chronic neck pain who is presenting today with persistent reaction which is thought to be after his second Covid shot on 04/03/2020.  Patient reports the day after the Covid vaccine he had fever, body aches and pains and difficulty walking.  He reports the fever finally subsided 3 days after the vaccine but he has continued to have pain in his hands and feet.  On 04/07/2020 because he was still having ongoing discomfort and then started having a full sensation in his throat he went to his doctor where he was placed on prednisone for 5 days and Benadryl and gabapentin for the pins and needle sensation in his hands and feet.  He reports symptoms were not improving and he saw his doctor again on 04/12/2020 at that time had blood work done that showed a sed rate of 69, mild leukocytosis of 11,000 but otherwise unchanged CMP, CK was normal and low vitamin D levels.  At that time patient was started on over-the-counter vitamin D and placed on increase gabapentin to 600 mg 3 times daily.  Patient reports his symptoms were not getting much better and he saw called his doctor over the weekend and was started back on prednisone 60 mg 2 days then 40 mg 2 days and he is currently on the 40 mg.  He was also started on hydroxyzine yesterday as his symptoms were not improving.  After taking the hydroxyzine he started noticing a rash diffusely over his body but most concentrated over the trunk.  The rash is not significantly itchy but he reports it feels like his back and tongue are on fire.  He has had no significant voice changes and has no trouble breathing.  He has been eating and drinking  normally.  He denies any abdominal pain, vomiting.  He has chronic neck pain but nothing beyond baseline.  He denies any contact with ticks and has very low risk as he does not spend much time outside or in wooded areas.  He denies any further fever after having the Covid vaccine.  He has not had cough.  Because of the rash starting after the hydroxyzine he decided to come to the ER and waited in the waiting room for over 10 hours.  Patient denies any drug, alcohol or tobacco use.  He does report that the pins-and-needles sensation in his bilateral hands have now subsided and is about a 4 out of 10 in his feet bilaterally compared to 10 out of 10 at the beginning.  He also has noted in the last 1 to 2 days some soreness in bilateral calves.  The history is provided by the patient.       Past Medical History:  Diagnosis Date  . Anxiety   . Elevated liver enzymes   . H/O degenerative disc disease   . H/O laminectomy    lumbar, 2009  . Herniated disc, cervical    L4,L5  . Herniation of cervical intervertebral disc with radiculopathy   . High cholesterol   . Hypercholesteremia   . Hypertension   . Hypogonadism, male   . Insomnia    PTSD  . Low testosterone   .  Migraine    once monthly    Patient Active Problem List   Diagnosis Date Noted  . Cervicalgia 03/14/2020  . Insomnia disorder, with other sleep disorder, recurrent 09/14/2019  . Neck pain 02/24/2017  . Insomnia, persistent 01/13/2013  . Panic attacks 01/13/2013    Past Surgical History:  Procedure Laterality Date  . BACK SURGERY     diskectomy  . CERVICAL DISC ARTHROPLASTY N/A 02/24/2017   Procedure: CERVICAL ANTERIOR DISC ARTHROPLASTY [name];  Surgeon: Venita Lick, MD;  Location: [facility] OR;  Service: Orthopedics;  Laterality: N/A;  3 hrs  . LAMINECTOMY  2009  . LUMBAR DISC SURGERY  2009  . SEPTOPLASTY         Family History  Problem Relation Age of Onset  . Diabetes Mother   . Hypertension Mother   . Stroke Mother    . Diabetes Father   . Migraines Sister        3 sisters    Social History   Tobacco Use  . Smoking status: Never Smoker  . Smokeless tobacco: Never Used  Vaping Use  . Vaping Use: Never used  Substance Use Topics  . Alcohol use: Yes    Comment: occasionally, one glass of wine monthly/ week  . Drug use: No    Home Medications Prior to Admission medications   Medication Sig Start Date End Date Taking? Authorizing Provider  amLODipine (NORVASC) 10 MG tablet Take 10 mg by mouth at bedtime.  12/20/12   [provider]  carbamazepine (TEGRETOL) 200 MG tablet SMARTSIG:1 Tablet(s) By Mouth Every 12 Hours 03/08/20   [provider]  Eszopiclone (ESZOPICLONE) 3 MG TABS Take 1 tablet (3 mg total) by mouth at bedtime as needed. Take immediately before bedtime 11/10/19   Dohmeier, Porfirio Mylar, MD  hydrochlorothiazide (HYDRODIURIL) 25 MG tablet Take 25 mg by mouth daily.    [provider]  HYDROmorphone (DILAUDID) 4 MG tablet hydromorphone 4 mg tablet  TAKE 1 TABLET BY MOUTH EVERY 4 HOURS AS NEEDED    [provider]  LORazepam (ATIVAN) 0.5 MG tablet Up to twice a day prn. 04/11/20   Dohmeier, Porfirio Mylar, MD  rosuvastatin (CRESTOR) 5 MG tablet Take 5 mg by mouth daily with supper. 01/27/17   [provider]  sertraline (ZOLOFT) 100 MG tablet Take 1 tablet (100 mg total) by mouth daily. 02/28/20   Dohmeier, Porfirio Mylar, MD  testosterone cypionate (DEPOTESTOTERONE CYPIONATE) 100 MG/ML injection Inject 100 mg into the muscle every 7 (seven) days. Takes on Fridays. For IM use only.    [provider]    Allergies    Losartan, Alprazolam, and Percocet [oxycodone-acetaminophen]  Review of Systems   Review of Systems  All other systems reviewed and are negative.   Physical Exam Updated Vital Signs BP (!) 167/102 (BP Location: Left Arm)   Pulse 96   Temp 98 F (36.7 C) (Oral)   Resp 16   SpO2 97%   Physical Exam Vitals and nursing note reviewed.   Constitutional:      General: He is not in acute distress.    Appearance: He is well-developed and normal weight.  HENT:     Head: Normocephalic and atraumatic.     Mouth/Throat:     Comments: No oral lesions present.  No lip, tongue or uvular edema. Eyes:     Conjunctiva/sclera: Conjunctivae normal.     Pupils: Pupils are equal, round, and reactive to light.  Cardiovascular:     Rate and Rhythm: Normal  rate and regular rhythm.     Pulses: Normal pulses.     Heart sounds: No murmur heard.   Pulmonary:     Effort: Pulmonary effort is normal. No respiratory distress.     Breath sounds: Normal breath sounds. No wheezing or rales.  Abdominal:     General: There is no distension.     Palpations: Abdomen is soft.     Tenderness: There is no abdominal tenderness. There is no guarding or rebound.  Musculoskeletal:        General: Tenderness present. Normal range of motion.     Cervical back: Normal range of motion and neck supple.     Right lower leg: No edema.     Left lower leg: No edema.     Comments: Sensation intact in bilateral feet.  Feet are warm, capillary refill less than 2 seconds and palpable pulses bilaterally.  Mild tenderness with palpation to bilateral calves but no notable swelling  Skin:    General: Skin is warm and dry.     Findings: Rash present. No erythema.     Comments: Patient has a confluent, raised erythematous rash present over the trunk and then patchy in the arms and upper legs.  Rash blanches and no evidence of purpura  Neurological:     Mental Status: He is alert and oriented to person, place, and time.  Psychiatric:        Mood and Affect: Mood normal.        Behavior: Behavior normal.        Thought Content: Thought content normal.     ED Results / Procedures / Treatments   Labs (all labs ordered are listed, but only abnormal results are displayed) Labs Reviewed  COMPREHENSIVE METABOLIC PANEL - Abnormal; Notable for the following components:       Result Value   Potassium 3.3 (*)    Chloride 93 (*)    CO2 34 (*)    ALT 125 (*)    All other components within normal limits  SEDIMENTATION RATE  CK    EKG None  Radiology No results found.  Procedures Procedures (including critical care time)  Medications Ordered in ED Medications  diphenhydrAMINE (BENADRYL) injection 25 mg (has no administration in time range)  methylPREDNISolone sodium succinate (SOLU-MEDROL) 125 mg/2 mL injection 125 mg (has no administration in time range)  famotidine (PEPCID) IVPB 20 mg premix (has no administration in time range)  gabapentin (NEURONTIN) tablet 600 mg (has no administration in time range)    ED Course  I have reviewed the triage vital signs and the nursing notes.  Pertinent labs & imaging results that were available during my care of the patient were reviewed by me and considered in my medical decision making (see chart for details).    MDM Rules/Calculators/A&P                          49 year old male presenting today with persistent symptoms thought to be related to reaction to the second Covid vaccine.  Patient currently has diffuse rash most notable over the trunk and for now 1 week has had a sensation of fullness in his throat.  Patient has been on prednisone and is currently taking prednisone as well as gabapentin for his symptoms.  Patient started hydroxyzine yesterday which is when the rash developed.  He denies the rash feeling itchy and no evidence of purpura or petechia.  Patient has no localized  joint tenderness or swelling.  Patient did have labs done approximately 1 week ago that showed an elevated sedimentation rate of 69 while on prednisone.  Concern for possible inflammatory response to the vaccine or autoimmune condition.  Low suspicion for infectious etiology as patient has had no further fever has low suspicion for tickborne illness and is otherwise well-appearing.  Patient does not appear to have anaphylaxis at this  time.  However it is unclear if patient is allergic to hydroxyzine and that is what caused the rash that started yesterday or if this is normal progression of his illness.  We will recheck labs to ensure no significant changes.  Patient given IV Benadryl, Solu-Medrol and Pepcid for potential allergic reaction.  He was also given IV fluids.  The pins-and-needles sensation in his hands and feet are improving on the 600 mg of gabapentin and he was given his home dose.  He denies any abdominal, urinary, respiratory or cardiac complaints and low suspicion for acute etiology in these areas.  No meningeal signs or concern for encephalitis or meningitis.  1:52 PM Pt's labs have improved with sed rate now 0.  CK wnl.  CMP with elevated ALT of 125 but AST is now normal.  RAsh is slightly improved after meds but no worse.  On re-evaluation pt also noted he started taking arimidex several days before getting his second Moderna vaccine and takes it 3 times a week.  This is a new medication and last dose was yesterday which is before he developed the rash.  Pt will hold arimidex in case that is possible cause of sx as well as hydroxizine.  He will finish prednisone and continue gabapentin and benadryl at this time.   Final Clinical Impression(s) / ED Diagnoses Final diagnoses:  Allergic reaction, initial encounter    Rx / DC Orders ED Discharge Orders    None       Gwyneth Sprout, MD 04/16/20 1610

## 2020-04-16 NOTE — ED Triage Notes (Signed)
Pt received his last Mederma vaccine on June 30th and the next day he had a fever, chills and having trouble walking, the next day he broke the fever but then a new symptom was his lips started to go numb, has been on prednisone for that, he still complains of pins and needles in his hands and feet Had blood work done on Wednesday and the only thing abnormal was vitamin D, and he was put on gabapentin for the pins and needles He had a rash develop on his arms after he started taking hydroxyzine because the benedryl wasn't working

## 2020-04-16 NOTE — Discharge Instructions (Signed)
Hold Arimidex and hydroxizine.  Finish out the prednisone and take benadryl 2tabs every 6 hours as needed for rash and itching.  Blood pressure was elevated here and follow up with your doctor for recheck to ensure it is improving.

## 2020-04-16 NOTE — ED Notes (Signed)
Patient reports his BP is never that high and he does not believe it is accurate. RN has re-taken BP multiple times and in different areas of the arm and BP readings remain elevated. Patient states we may need to try manual pressure

## 2020-04-22 ENCOUNTER — Encounter: Payer: Self-pay | Admitting: Neurology

## 2020-04-24 DIAGNOSIS — Z887 Allergy status to serum and vaccine status: Secondary | ICD-10-CM | POA: Diagnosis not present

## 2020-04-24 DIAGNOSIS — L509 Urticaria, unspecified: Secondary | ICD-10-CM | POA: Diagnosis not present

## 2020-05-01 ENCOUNTER — Other Ambulatory Visit: Payer: Self-pay | Admitting: Neurology

## 2020-05-01 MED ORDER — LORAZEPAM 0.5 MG PO TABS: ORAL_TABLET | ORAL | 1 refills | Status: DC

## 2020-05-08 ENCOUNTER — Encounter: Payer: Self-pay | Admitting: Neurology

## 2020-05-08 DIAGNOSIS — Z0289 Encounter for other administrative examinations: Secondary | ICD-10-CM

## 2020-05-10 DIAGNOSIS — G894 Chronic pain syndrome: Secondary | ICD-10-CM | POA: Diagnosis not present

## 2020-05-13 ENCOUNTER — Encounter: Payer: Self-pay | Admitting: Neurology
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
## 2020-05-14 ENCOUNTER — Encounter: Payer: Self-pay | Admitting: Neurology

## 2020-05-14 MED ORDER — ESZOPICLONE 3 MG PO TABS: 3.0000 mg | ORAL_TABLET | Freq: Every evening | ORAL | 0 refills | Status: DC | PRN

## 2020-06-07 DIAGNOSIS — R748 Abnormal levels of other serum enzymes: Secondary | ICD-10-CM | POA: Diagnosis not present

## 2020-06-07 DIAGNOSIS — E559 Vitamin D deficiency, unspecified: Secondary | ICD-10-CM | POA: Diagnosis not present

## 2020-06-11 ENCOUNTER — Ambulatory Visit: Payer: BC Managed Care – PPO | Admitting: Neurology

## 2020-06-11 ENCOUNTER — Other Ambulatory Visit: Payer: Self-pay

## 2020-06-11 ENCOUNTER — Encounter: Payer: Self-pay | Admitting: Neurology

## 2020-06-11 VITALS — BP 154/94 | HR 82 | Ht 70.0 in | Wt 223.0 lb

## 2020-06-11 DIAGNOSIS — F410 Panic disorder [episodic paroxysmal anxiety] without agoraphobia: Secondary | ICD-10-CM | POA: Diagnosis not present

## 2020-06-11 DIAGNOSIS — G478 Other sleep disorders: Secondary | ICD-10-CM

## 2020-06-11 DIAGNOSIS — G4700 Insomnia, unspecified: Secondary | ICD-10-CM

## 2020-06-11 MED ORDER — BELSOMRA 20 MG PO TABS: 20.0000 mg | ORAL_TABLET | Freq: Every evening | ORAL | 0 refills | Status: DC | PRN

## 2020-06-11 MED ORDER — LORAZEPAM 0.5 MG PO TABS: ORAL_TABLET | ORAL | 3 refills | Status: DC

## 2020-06-11 NOTE — Patient Instructions (Signed)
Suvorexant oral tablets °What is this medicine? °SUVOREXANT (su-vor-EX-ant) is used to treat insomnia. This medicine helps you to fall asleep and sleep through the night. °This medicine may be used for other purposes; ask your health care provider or pharmacist if you have questions. °COMMON BRAND NAME(S): Belsomra °What should I tell my health care provider before I take this medicine? °They need to know if you have any of these conditions: °· depression °· drink alcohol °· drug abuse or addiction °· feel sleepy or have fallen asleep suddenly during the day °· history of a sudden onset of muscle weakness (cataplexy) °· liver disease °· lung or breathing disease, like asthma or emphysema °· sleep apnea °· suicidal thoughts, plans, or attempt; a previous suicide attempt by you or a family member °· an unusual or allergic reaction to suvorexant, other medicines, foods, dyes, or preservatives °· pregnant or trying to get pregnant °· breast-feeding °How should I use this medicine? °Take this medicine by mouth within 30 minutes of going to bed. Do not take it unless you are able to stay in bed a full night before you must be active again. Follow the directions on the prescription label. You may take this medicine with or without a food. However, this medicine may take longer to work if you take it with or right after meals. Do not take your medicine more often than directed. Do not stop taking this medicine on your own. Always follow your doctor or health care professional's advice. °A special MedGuide will be given to you by the pharmacist with each prescription and refill. Be sure to read this information carefully each time. °Talk to your pediatrician regarding the use of this medicine in children. Special care may be needed. °Overdosage: If you think you have taken too much of this medicine contact a poison control center or emergency room at once. °NOTE: This medicine is only for you. Do not share this medicine with  others. °What if I miss a dose? °This medicine should only be taken immediately before going to sleep. Do not take double or extra doses. °What may interact with this medicine? °· alcohol °· antihistamines for allergy, cough, or cold °· aprepitant °· boceprevir °· certain antibiotics like ciprofloxacin, clarithromycin, erythromycin, telithromycin °· certain antivirals for HIV or AIDS °· certain medicines for anxiety or sleep °· certain medicines for depression like amitriptyline, fluoxetine, nefazodone, sertraline °· certain medicines for fungal infections like ketoconazole, posaconazole, fluconazole, itraconazole °· certain medicines for seizures like carbamazepine, phenobarbital, primidone, phenytoin °· conivaptan °· digoxin °· diltiazem °· general anesthetics like halothane, isoflurane, methoxyflurane, propofol °· grapefruit juice °· imatinib °· medicines that relax muscles for surgery °· narcotic medicines for pain °· phenothiazines like chlorpromazine, mesoridazine, prochlorperazine, thioridazine °· rifampin °· verapamil °This list may not describe all possible interactions. Give your health care provider a list of all the medicines, herbs, non-prescription drugs, or dietary supplements you use. Also tell them if you smoke, drink alcohol, or use illegal drugs. Some items may interact with your medicine. °What should I watch for while using this medicine? °Visit your health care professional for regular checks on your progress. Tell your health care professional if your symptoms do not start to get better or if they get worse. Avoid caffeine-containing drinks in the evening hours. °After taking this medicine, you may get up out of bed and do an activity that you do not know you are doing. The next morning, you may have no memory of this.   Activities include driving a car ("sleep-driving"), making and eating food, talking on the phone, sexual activity, and sleep-walking. Serious injuries have occurred. Call your  doctor right away if you find out you have done any of these activities. Do not take this medicine if you have used alcohol that evening. Do not take it if you have taken another medicine for sleep. °Do not take this medicine unless you are able to stay in bed for a full night (7 to 8 hours) and do not drive or perform other activities requiring full alertness within 8 hours of a dose. Do not drive, use machinery, or do anything that needs mental alertness the day after you take the 20 mg dose of this medicine. The use of lower doses (10 mg) may also cause driving impairment the next day. You may have a decrease in mental alertness the day after use, even if you feel that you are fully awake. Tell your doctor if you will need to perform activities requiring full alertness, such as driving, the next day. Do not stand or sit up quickly after taking this medicine, especially if you are an older patient. This reduces the risk of dizzy or fainting spells. °If you or your family notice any changes in your behavior, such as new or worsening depression, thoughts of harming yourself, anxiety, other unusual or disturbing thoughts, or memory loss, call your health care professional right away. °After you stop taking this medicine, you may have trouble falling asleep. This is called rebound insomnia. This problem usually goes away on its own after 1 or 2 nights. °What side effects may I notice from receiving this medicine? °Side effects that you should report to your doctor or health care professional as soon as possible: °· allergic reactions like skin rash, itching or hives, swelling of the face, lips, or tongue °· hallucinations °· periods of leg weakness lasting from seconds to a few minutes °· suicidal thoughts, mood changes °· unable to move or speak for several minutes while going to sleep or waking up °· unusual activities while not fully awake like driving, eating, making phone calls, or sexual activity °Side effects  that usually do not require medical attention (report these to your doctor or health care professional if they continue or are bothersome): °· daytime drowsiness °· headache °· nightmares or abnormal dreams °· tiredness °This list may not describe all possible side effects. Call your doctor for medical advice about side effects. You may report side effects to FDA at 1-800-FDA-1088. °Where should I keep my medicine? °Keep out of the reach of children. This medicine can be abused. Keep your medicine in a safe place to protect it from theft. Do not share this medicine with anyone. Selling or giving away this medicine is dangerous and against the law. °Store at room temperature between 15 and 30 degrees C (59 and 86 degrees F). Throw away any unused medicine after the expiration date. °NOTE: This sheet is a summary. It may not cover all possible information. If you have questions about this medicine, talk to your doctor, pharmacist, or health care provider. °© 2020 [employer] (2018-10-07 16:37:12) ° °
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
## 2020-06-17 ENCOUNTER — Other Ambulatory Visit: Payer: Self-pay | Admitting: Family Medicine

## 2020-06-17 DIAGNOSIS — R748 Abnormal levels of other serum enzymes: Secondary | ICD-10-CM

## 2020-06-19 ENCOUNTER — Ambulatory Visit
Admission: RE | Admit: 2020-06-19 | Discharge: 2020-06-19 | Disposition: A | Discharge status: Home / Self Care | Payer: BC Managed Care – PPO | Source: Ambulatory Visit | Attending: Family Medicine | Admitting: Family Medicine

## 2020-06-19 DIAGNOSIS — R748 Abnormal levels of other serum enzymes: Secondary | ICD-10-CM | POA: Diagnosis not present

## 2020-06-25 ENCOUNTER — Encounter: Payer: Self-pay | Admitting: Neurology

## 2020-06-26 ENCOUNTER — Encounter: Payer: Self-pay | Admitting: Neurology

## 2020-06-26 MED ORDER — ALPRAZOLAM ER 2 MG PO TB24: 2.0000 mg | ORAL_TABLET | Freq: Every evening | ORAL | 0 refills | Status: DC | PRN

## 2020-06-26 NOTE — Telephone Encounter (Signed)
I will try X R 24 hour Xanax at a higher dose . I have nothing else to offer !

## 2020-06-26 NOTE — Telephone Encounter (Signed)
Zolpidem should not be mixed with benzodiazepines.

## 2020-06-27 ENCOUNTER — Encounter: Payer: Self-pay | Admitting: Neurology

## 2020-07-22 ENCOUNTER — Encounter: Payer: Self-pay | Admitting: Neurology

## 2020-07-22 ENCOUNTER — Other Ambulatory Visit: Payer: Self-pay | Admitting: Neurology

## 2020-07-22 MED ORDER — ALPRAZOLAM ER 2 MG PO TB24: 2.0000 mg | ORAL_TABLET | Freq: Every evening | ORAL | 0 refills | Status: DC | PRN

## 2020-07-23 DIAGNOSIS — Z0271 Encounter for disability determination: Secondary | ICD-10-CM

## 2020-07-26 DIAGNOSIS — E782 Mixed hyperlipidemia: Secondary | ICD-10-CM | POA: Diagnosis not present

## 2020-07-26 DIAGNOSIS — R634 Abnormal weight loss: Secondary | ICD-10-CM | POA: Diagnosis not present

## 2020-07-26 DIAGNOSIS — R7309 Other abnormal glucose: Secondary | ICD-10-CM | POA: Diagnosis not present

## 2020-07-26 DIAGNOSIS — R7989 Other specified abnormal findings of blood chemistry: Secondary | ICD-10-CM | POA: Diagnosis not present

## 2020-08-01 DIAGNOSIS — Z5181 Encounter for therapeutic drug level monitoring: Secondary | ICD-10-CM | POA: Diagnosis not present

## 2020-08-01 DIAGNOSIS — M62838 Other muscle spasm: Secondary | ICD-10-CM | POA: Diagnosis not present

## 2020-08-01 DIAGNOSIS — Z79899 Other long term (current) drug therapy: Secondary | ICD-10-CM | POA: Diagnosis not present

## 2020-08-01 DIAGNOSIS — G894 Chronic pain syndrome: Secondary | ICD-10-CM | POA: Diagnosis not present

## 2020-08-01 DIAGNOSIS — M961 Postlaminectomy syndrome, not elsewhere classified: Secondary | ICD-10-CM | POA: Diagnosis not present

## 2020-08-16 ENCOUNTER — Other Ambulatory Visit: Payer: Self-pay | Admitting: Neurology

## 2020-08-16 MED ORDER — ALPRAZOLAM ER 2 MG PO TB24: 2.0000 mg | ORAL_TABLET | Freq: Every evening | ORAL | 0 refills | Status: DC | PRN

## 2020-08-20 DIAGNOSIS — R7989 Other specified abnormal findings of blood chemistry: Secondary | ICD-10-CM | POA: Diagnosis not present

## 2020-08-20 DIAGNOSIS — R634 Abnormal weight loss: Secondary | ICD-10-CM | POA: Diagnosis not present

## 2020-08-20 DIAGNOSIS — E782 Mixed hyperlipidemia: Secondary | ICD-10-CM | POA: Diagnosis not present

## 2020-08-20 DIAGNOSIS — M542 Cervicalgia: Secondary | ICD-10-CM | POA: Diagnosis not present

## 2020-08-20 DIAGNOSIS — I10 Essential (primary) hypertension: Secondary | ICD-10-CM | POA: Diagnosis not present

## 2020-09-01 ENCOUNTER — Other Ambulatory Visit: Payer: Self-pay | Admitting: Neurology

## 2020-09-07 DIAGNOSIS — M5412 Radiculopathy, cervical region: Secondary | ICD-10-CM | POA: Diagnosis not present

## 2020-09-16 ENCOUNTER — Other Ambulatory Visit: Payer: Self-pay | Admitting: Neurology

## 2020-09-16 ENCOUNTER — Encounter: Payer: Self-pay | Admitting: Neurology

## 2020-09-16 ENCOUNTER — Ambulatory Visit: Payer: BC Managed Care – PPO | Admitting: Neurology

## 2020-09-16 MED ORDER — ALPRAZOLAM ER 2 MG PO TB24: 2.0000 mg | ORAL_TABLET | Freq: Every evening | ORAL | 0 refills | Status: DC | PRN

## 2020-09-27 DIAGNOSIS — Z20822 Contact with and (suspected) exposure to covid-19: Secondary | ICD-10-CM | POA: Diagnosis not present

## 2020-10-14 ENCOUNTER — Encounter: Payer: Self-pay | Admitting: Neurology

## 2020-10-14 ENCOUNTER — Other Ambulatory Visit: Payer: Self-pay | Admitting: Neurology

## 2020-10-14 MED ORDER — ALPRAZOLAM ER 2 MG PO TB24: 2.0000 mg | ORAL_TABLET | Freq: Every evening | ORAL | 0 refills | Status: DC | PRN

## 2020-10-14 NOTE — Telephone Encounter (Signed)
Pt is up to date on his appts. Pt is due for a refill on xanax. The [place] Controlled Substance Registry checked and is appropriate. Per Registry, last picked pu on 09/16/2021. Pt appears to be taking xanax 4mg  QHS regularly instead of PRN.

## 2020-10-15 DIAGNOSIS — M961 Postlaminectomy syndrome, not elsewhere classified: Secondary | ICD-10-CM | POA: Diagnosis not present

## 2020-10-15 DIAGNOSIS — G894 Chronic pain syndrome: Secondary | ICD-10-CM | POA: Diagnosis not present

## 2020-10-28 ENCOUNTER — Encounter: Payer: Self-pay | Admitting: Neurology

## 2020-11-03 ENCOUNTER — Encounter (HOSPITAL_COMMUNITY): Payer: Self-pay

## 2020-11-03 ENCOUNTER — Emergency Department (HOSPITAL_COMMUNITY)
Admission: EM | Admit: 2020-11-03 | Discharge: 2020-11-03 | Disposition: A | Discharge status: Home / Self Care | Payer: BC Managed Care – PPO | Attending: Emergency Medicine | Admitting: Emergency Medicine

## 2020-11-03 ENCOUNTER — Emergency Department (HOSPITAL_COMMUNITY): Payer: BC Managed Care – PPO

## 2020-11-03 DIAGNOSIS — R404 Transient alteration of awareness: Secondary | ICD-10-CM | POA: Diagnosis not present

## 2020-11-03 DIAGNOSIS — S0990XA Unspecified injury of head, initial encounter: Secondary | ICD-10-CM | POA: Diagnosis not present

## 2020-11-03 DIAGNOSIS — W109XXA Fall (on) (from) unspecified stairs and steps, initial encounter: Secondary | ICD-10-CM | POA: Diagnosis not present

## 2020-11-03 DIAGNOSIS — M542 Cervicalgia: Secondary | ICD-10-CM | POA: Insufficient documentation

## 2020-11-03 DIAGNOSIS — S065X9A Traumatic subdural hemorrhage with loss of consciousness of unspecified duration, initial encounter: Secondary | ICD-10-CM

## 2020-11-03 DIAGNOSIS — S0001XA Abrasion of scalp, initial encounter: Secondary | ICD-10-CM | POA: Diagnosis not present

## 2020-11-03 DIAGNOSIS — I10 Essential (primary) hypertension: Secondary | ICD-10-CM | POA: Insufficient documentation

## 2020-11-03 DIAGNOSIS — S3991XA Unspecified injury of abdomen, initial encounter: Secondary | ICD-10-CM | POA: Diagnosis not present

## 2020-11-03 DIAGNOSIS — I6200 Nontraumatic subdural hemorrhage, unspecified: Secondary | ICD-10-CM | POA: Diagnosis not present

## 2020-11-03 DIAGNOSIS — S065XAA Traumatic subdural hemorrhage with loss of consciousness status unknown, initial encounter: Secondary | ICD-10-CM

## 2020-11-03 DIAGNOSIS — S065X0A Traumatic subdural hemorrhage without loss of consciousness, initial encounter: Secondary | ICD-10-CM | POA: Diagnosis not present

## 2020-11-03 DIAGNOSIS — Z79899 Other long term (current) drug therapy: Secondary | ICD-10-CM | POA: Insufficient documentation

## 2020-11-03 DIAGNOSIS — Z20822 Contact with and (suspected) exposure to covid-19: Secondary | ICD-10-CM | POA: Diagnosis not present

## 2020-11-03 DIAGNOSIS — S199XXA Unspecified injury of neck, initial encounter: Secondary | ICD-10-CM | POA: Diagnosis not present

## 2020-11-03 DIAGNOSIS — S29012A Strain of muscle and tendon of back wall of thorax, initial encounter: Secondary | ICD-10-CM

## 2020-11-03 DIAGNOSIS — S12490A Other displaced fracture of fifth cervical vertebra, initial encounter for closed fracture: Secondary | ICD-10-CM | POA: Diagnosis not present

## 2020-11-03 DIAGNOSIS — R4182 Altered mental status, unspecified: Secondary | ICD-10-CM | POA: Insufficient documentation

## 2020-11-03 DIAGNOSIS — W19XXXA Unspecified fall, initial encounter: Secondary | ICD-10-CM | POA: Diagnosis not present

## 2020-11-03 DIAGNOSIS — S299XXA Unspecified injury of thorax, initial encounter: Secondary | ICD-10-CM | POA: Diagnosis not present

## 2020-11-03 DIAGNOSIS — Z043 Encounter for examination and observation following other accident: Secondary | ICD-10-CM | POA: Diagnosis not present

## 2020-11-03 DIAGNOSIS — S129XXA Fracture of neck, unspecified, initial encounter: Secondary | ICD-10-CM

## 2020-11-03 DIAGNOSIS — S12000A Unspecified displaced fracture of first cervical vertebra, initial encounter for closed fracture: Secondary | ICD-10-CM | POA: Diagnosis not present

## 2020-11-03 LAB — ETHANOL: Alcohol, Ethyl (B): <10 mg/dL (ref <10)

## 2020-11-03 LAB — CBC
HCT: 48.3 % (ref 39.0–52.0)
Hemoglobin: 16.1 g/dL (ref 13.0–17.0)
MCH: 29.4 pg (ref 26.0–34.0)
MCHC: 33.3 g/dL (ref 30.0–36.0)
MCV: 88.1 fL (ref 80.0–100.0)
Platelets: 237 10*3/uL (ref 150–400)
RBC: 5.48 MIL/uL (ref 4.22–5.81)
RDW: 12.8 % (ref 11.5–15.5)
WBC: 6.4 10*3/uL (ref 4.0–10.5)
nRBC: 0 % (ref 0.0–0.2)

## 2020-11-03 LAB — BASIC METABOLIC PANEL
Anion gap: 12 (ref 5–15)
BUN: 11 mg/dL (ref 6–20)
CO2: 26 mmol/L (ref 22–32)
Calcium: 9.1 mg/dL (ref 8.9–10.3)
Chloride: 102 mmol/L (ref 98–111)
Creatinine, Ser: 0.99 mg/dL (ref 0.61–1.24)
GFR, Estimated: >60 mL/min (ref >60)
Glucose, Bld: 128 mg/dL — ABNORMAL HIGH (ref 70–99)
Potassium: 3.2 mmol/L — ABNORMAL LOW (ref 3.5–5.1)
Sodium: 140 mmol/L (ref 135–145)

## 2020-11-03 LAB — SARS CORONAVIRUS 2 BY RT PCR (HOSPITAL ORDER, PERFORMED IN ~~LOC~~ HOSPITAL LAB): SARS Coronavirus 2: NEGATIVE

## 2020-11-03 MED ORDER — CELECOXIB 200 MG PO CAPS: 200.0000 mg | ORAL_CAPSULE | Freq: Two times a day (BID) | ORAL | 0 refills | Status: DC

## 2020-11-03 MED ORDER — METHOCARBAMOL 500 MG PO TABS: 500.0000 mg | ORAL_TABLET | Freq: Two times a day (BID) | ORAL | 0 refills | Status: DC

## 2020-11-03 MED ORDER — HYDROMORPHONE HCL 1 MG/ML IJ SOLN
1.0000 mg | Freq: Once | INTRAMUSCULAR | Status: AC
  Administered 2020-11-03: 1 mg via INTRAVENOUS
  Filled 2020-11-03: qty 1

## 2020-11-03 MED ORDER — MIDAZOLAM HCL 2 MG/2ML IJ SOLN
1.0000 mg | Freq: Once | INTRAMUSCULAR | Status: AC
  Administered 2020-11-03: 1 mg via INTRAVENOUS
  Filled 2020-11-03: qty 2

## 2020-11-03 MED ORDER — ONDANSETRON HCL 4 MG/2ML IJ SOLN
4.0000 mg | Freq: Once | INTRAMUSCULAR | Status: AC
  Administered 2020-11-03: 4 mg via INTRAVENOUS
  Filled 2020-11-03: qty 2

## 2020-11-03 MED ORDER — IOHEXOL 300 MG/ML  SOLN
100.0000 mL | Freq: Once | INTRAMUSCULAR | Status: AC | PRN
  Administered 2020-11-03: 100 mL via INTRAVENOUS

## 2020-11-03 MED ORDER — GADOBUTROL 1 MMOL/ML IV SOLN
10.0000 mL | Freq: Once | INTRAVENOUS | Status: AC | PRN
  Administered 2020-11-03: 10 mL via INTRAVENOUS

## 2020-11-03 NOTE — ED Notes (Addendum)
Pt unable to urinate supine. Urinal given when he is able. Pt knows he cant get up at this time. Wife at bedside.

## 2020-11-03 NOTE — ED Provider Notes (Signed)
[facility] EMERGENCY DEPARTMENT Provider Note   CSN: [id number] Arrival date & time: 11/03/20  6015     History Chief Complaint  Patient presents with  . Fall    [name] is a 50 y.o. male.who presents for fall. The patient is amnestic to the event however it is reported that he fell down 10-15 steps. Patient c/o neck pain. Review of EMR shows a hx of lumbar and cervical suregeies and chronic pain syndrome. Patient denies any new numbness, weakness, or tingling in the lower extremities.    HPI     Past Medical History:  Diagnosis Date  . Anxiety   . Elevated liver enzymes   . H/O degenerative disc disease   . H/O laminectomy    lumbar, 2009  . Herniated disc, cervical    L4,L5  . Herniation of cervical intervertebral disc with radiculopathy   . High cholesterol   . Hypercholesteremia   . Hypertension   . Hypogonadism, male   . Insomnia    PTSD  . Low testosterone   . Migraine    once monthly    Patient Active Problem List   Diagnosis Date Noted  . Cervicalgia 03/14/2020  . Insomnia disorder, with other sleep disorder, recurrent 09/14/2019  . Neck pain 02/24/2017  . Insomnia, persistent 01/13/2013  . Panic attacks 01/13/2013    Past Surgical History:  Procedure Laterality Date  . BACK SURGERY     diskectomy  . CERVICAL DISC ARTHROPLASTY N/A 02/24/2017   Procedure: CERVICAL ANTERIOR DISC ARTHROPLASTY [name];  Surgeon: Venita Lick, MD;  Location: [facility] OR;  Service: Orthopedics;  Laterality: N/A;  3 hrs  . LAMINECTOMY  2009  . LUMBAR DISC SURGERY  2009  . SEPTOPLASTY         Family History  Problem Relation Age of Onset  . Diabetes Mother   . Hypertension Mother   . Stroke Mother   . Diabetes Father   . Migraines Sister        3 sisters    Social History   Tobacco Use  . Smoking status: Never Smoker  . Smokeless tobacco: Never Used  Vaping Use  . Vaping Use: Never used  Substance Use Topics  . Alcohol use: Yes     Comment: occasionally, one glass of wine monthly/ week  . Drug use: No    Home Medications Prior to Admission medications   Medication Sig Start Date End Date Taking? Authorizing Provider  ALPRAZolam (XANAX XR) 2 MG 24 hr tablet Take 1 tablet (2 mg total) by mouth at bedtime and may repeat dose one time if needed. 10/14/20   Dohmeier, Porfirio Mylar, MD  amLODipine (NORVASC) 10 MG tablet Take 10 mg by mouth at bedtime.  12/20/12   [provider]  hydrochlorothiazide (HYDRODIURIL) 25 MG tablet Take 25 mg by mouth daily.    [provider]  HYDROcodone-acetaminophen (NORCO) 10-325 MG tablet Take 2 tablets by mouth 2 (two) times daily as needed. 06/02/20   [provider]  rosuvastatin (CRESTOR) 5 MG tablet Take 5 mg by mouth daily with supper. 01/27/17   [provider]  sertraline (ZOLOFT) 100 MG tablet TAKE ONE TABLET BY MOUTH DAILY 09/02/20   Dohmeier, Porfirio Mylar, MD  Suvorexant (BELSOMRA) 20 MG TABS Take 20 mg by mouth at bedtime as needed. 06/11/20   Dohmeier, Porfirio Mylar, MD  testosterone cypionate (DEPOTESTOTERONE CYPIONATE) 100 MG/ML injection Inject 100 mg into the muscle every 7 (seven) days. Takes on Fridays. For  IM use only.    [provider]    Allergies    Losartan, Alprazolam, and Percocet [oxycodone-acetaminophen]  Review of Systems   Review of Systems  Unable to perform ROS: Mental status change    Physical Exam Updated Vital Signs BP (!) 165/96   Pulse 97   Temp (!) 97.5 F (36.4 C) (Oral)   Resp 18   SpO2 96%   Physical Exam Constitutional:      Appearance: He is obese. He is not ill-appearing.  HENT:     Head: Normocephalic.     Comments: Small abrasion to the Left parietal scalp    Right Ear: Tympanic membrane normal.     Left Ear: Tympanic membrane normal.     Ears:     Comments: No hemotympanum    Nose: Nose normal.     Mouth/Throat:     Mouth: Mucous membranes are moist.  Eyes:     Extraocular Movements: Extraocular  movements intact.     Pupils: Pupils are equal, round, and reactive to light.  Neck:     Comments: C-collar in place Cardiovascular:     Rate and Rhythm: Normal rate and regular rhythm.  Pulmonary:     Effort: Pulmonary effort is normal.     Breath sounds: Normal breath sounds.  Chest:     Chest wall: No tenderness.  Abdominal:     General: Abdomen is flat. There is no distension.     Tenderness: There is no abdominal tenderness.  Musculoskeletal:        General: No swelling, tenderness, deformity or signs of injury.     Right lower leg: No edema.     Left lower leg: No edema.     Comments: Moves all extremities with ease and normal rom  Skin:    Capillary Refill: Capillary refill takes less than 2 seconds.  Neurological:     Mental Status: He is alert. He is disoriented.     Cranial Nerves: No cranial nerve deficit.     Sensory: No sensory deficit.     Motor: No weakness.     ED Results / Procedures / Treatments   Labs (all labs ordered are listed, but only abnormal results are displayed) Labs Reviewed  CBC  BASIC METABOLIC PANEL    EKG EKG Interpretation  Date/Time:  Sunday November 03 2020 06:33:08 EST Ventricular Rate:  97 PR Interval:    QRS Duration: 118 QT Interval:  389 QTC Calculation: 495 R Axis:   57 Text Interpretation: Sinus rhythm Nonspecific intraventricular conduction delay No significant change since last tracing Confirmed by Gwyneth Sprout ([id number]) on 11/03/2020 7:04:09 AM   Radiology No results found.  Procedures .Critical Care Performed by: Arthor Captain, PA-C Authorized by: Arthor Captain, PA-C   Critical care provider statement:    Critical care time (minutes):  70   Critical care time was exclusive of:  Separately billable procedures and treating other patients   Critical care was necessary to treat or prevent imminent or life-threatening deterioration of the following conditions:  CNS failure or compromise   Critical care was  time spent personally by me on the following activities:  Discussions with consultants, evaluation of patient's response to treatment, examination of patient, ordering and performing treatments and interventions, ordering and review of laboratory studies, ordering and review of radiographic studies, pulse oximetry, re-evaluation of patient's condition, obtaining history from patient or surrogate and review of old charts     Medications Ordered in ED  Medications - No data to display  ED Course  I have reviewed the triage vital signs and the nursing notes.  Pertinent labs & imaging results that were available during my care of the patient were reviewed by me and considered in my medical decision making (see chart for details).  Clinical Course as of 11/03/20 9622  Wynelle Link Nov 03, 2020  0856 I received an urgent call from Dr. Linde Gillis of radiology discussing small subdural hematoma as well as a osteophyte fracture with fluid around the anterior portion of the neck concerning for unstable ligamentous injury.  I discussed with Dr. Johnsie Cancel who asked to get an MRI of the C-spine.  I updated the patient's wife at bedside.  Patient continues to be confused to time and states that the year is 2002 and that the month is Tuesday [AH]    Clinical Course User Index [AH] Arthor Captain, PA-C   MDM Rules/Calculators/A&P                         50 year old male here with some altered mental status after a fall down the stairs.The emergent differential diagnosis for trauma is extensive and requires complex medical decision making. The differential includes, but is not limited to traumatic brain injury, Orbital trauma, maxillofacial trauma, skull fracture, blunt/penetrating neck trauma, vertebral artery dissection, whiplash, cervical fracture, neurogenic shock, spinal cord injury, thoracic trauma (blunt/penetrating) cardiac trauma, thoracic and lumbar spine trauma. Abdominal trauma (blunt. Penetrating),  genitourinary trauma, extremity fractures, skin lacerations/ abrasions, vascular injuries. I ordered and reviewed CT head and neck which shows small subdural hematoma and a an anterior osteophyte fracture concerning for potential ligamentous instability.  I consulted with Dr. Maurice Small who asked for MRI with and without contrast.  MRI returned and shows no evidence of ligamentous instability.  Per Dr. Maurice Small the patient's subdural hematoma is insignificant and he will not need any follow-up but should return if he is having any worsening in mentation.  He remains only mildly confused at this time.  Also will stay in his neck brace for the next 6 weeks he may follow-up with him in the outpatient setting.  Patient will be discharged with anti-inflammatory medications.  He is already on buprenorphine medication at home.  He is ambulatory and stable for discharge at this time       Final Clinical Impression(s) / ED Diagnoses Final diagnoses:  None    Rx / DC Orders ED Discharge Orders    None       Arthor Captain, PA-C 11/03/20 1352    Gwyneth Sprout, MD 11/07/20 [phone]

## 2020-11-03 NOTE — ED Notes (Signed)
Patient transported to CT 

## 2020-11-03 NOTE — Discharge Instructions (Addendum)
Please keep your neck brace on at all times for the next 6 weeks and follow up wit Dr. Maurice Small as he has discussed with you.   Contact a health care provider if: You have irritation or sores on your skin from your brace or cervical collar. Get help right away if: You have neck pain that gets worse. You develop difficulties swallowing or breathing. You develop swelling in your neck. You have any of the following problems in your arms or legs or both: Numbness. Weakness. Burning pain. Movement problems. You are unable to control when you urinate or have a bowel movement (incontinence). You have problems with coordination or difficulty walking.

## 2020-11-03 NOTE — ED Triage Notes (Signed)
Pt comes via [facility] after a fall down 16 steps, unwitnessed. Possible LOC, initially AxO X 0, now AxO x 2, c/o of neck pain.

## 2020-11-03 NOTE — ED Notes (Signed)
Patient transported to MRI 

## 2020-11-03 NOTE — Consult Note (Signed)
Neurosurgery Consultation  Reason for Consult: Cervical spine fracture Referring Physician: Anitra Lauth  CC: Neck pain  HPI: This is a 50 y.o. man that presents with severe acute on chronic neck pain after a fall down stairs. He is amnestic to the event, presumably due to head trauma. He denies any new radicular pain, now new weakness, numbness, or parasthesias, no recent change in bowel or bladder function. He has chronic neck pain, but it is significantly worse after the fall. His chronic low back pain is stable. He previously had a cervical arthroplasty with Shon Baton but is currently in eval for a cervical SCS.   ROS: A 14 point ROS was performed and is negative except as noted in the HPI.   PMHx:  Past Medical History:  Diagnosis Date  . Anxiety   . Elevated liver enzymes   . H/O degenerative disc disease   . H/O laminectomy    lumbar, 2009  . Herniated disc, cervical    L4,L5  . Herniation of cervical intervertebral disc with radiculopathy   . High cholesterol   . Hypercholesteremia   . Hypertension   . Hypogonadism, male   . Insomnia    PTSD  . Low testosterone   . Migraine    once monthly   FamHx:  Family History  Problem Relation Age of Onset  . Diabetes Mother   . Hypertension Mother   . Stroke Mother   . Diabetes Father   . Migraines Sister        3 sisters   SocHx:  reports that he has never smoked. He has never used smokeless tobacco. He reports current alcohol use. He reports that he does not use drugs.  Exam: Vital signs in last 24 hours: Temp:  [97.5 F (36.4 C)] 97.5 F (36.4 C) (01/30 2353) Pulse Rate:  [94-97] 95 (01/30 0925) Resp:  [18-24] 18 (01/30 0925) BP: (165-180)/(90-96) 180/90 (01/30 0925) SpO2:  [96 %] 96 % (01/30 0925) General: Awake, alert, cooperative, lying in bed in NAD Head: Normocephalic and atruamatic HEENT: In well-fitting cervical collar Pulmonary: breathing room air comfortably, no evidence of increased work of  breathing Cardiac: RRR Abdomen: S NT ND Extremities: Warm and well perfused x4 Neuro: AOx3, PERRL, EOMI, FS Strength 5/5 x4, SILTx4   Assessment and Plan: 50 y.o. man s/p fall down stairs with acute on chronic neck pain. CT C-spine personally reviewed, which shows known artificial disc at [name] that has since fused, above the fracture there are fractures through the inferior and superior osteophytes unilaterally in the C5 vertebral body. MRI shows prevertebral swelling, possible but not confirmed ALL injury, intact PLL and PLC. No e/o subluxation or deformity at those levels. CTH w/ small 57mm parafalcine SDH.  -no acute neurosurgical intervention indicated at this time -pt should wear C-collar x6 weeks to allow fractures to heal. He can follow up with Shon Baton or myself in 6 weeks to re-evaluate and d/c the collar, I told the patient that if he has an established relationship w/ Shon Baton that I would only have him see me if the patient prefers.  -SDH is similarly non-operative, okay for discharge. If he follows up with Shon Baton for the cervical spine, no need for scheduled follow up with me for the SDH. -please call with any concerns or questions  Jadene Pierini, MD 11/03/20 12:20 PM [facility]

## 2020-11-03 NOTE — ED Notes (Signed)
MIAMI J c-collar applied wth the help of Darryl, nurse tech. Pt and wife educated.

## 2020-11-03 NOTE — ED Notes (Signed)
Back from CT and MRI.

## 2020-11-18 ENCOUNTER — Other Ambulatory Visit: Payer: Self-pay | Admitting: Neurology

## 2020-11-18 MED ORDER — ALPRAZOLAM ER 2 MG PO TB24: 2.0000 mg | ORAL_TABLET | Freq: Every evening | ORAL | 0 refills | Status: DC | PRN

## 2020-11-28 DIAGNOSIS — E782 Mixed hyperlipidemia: Secondary | ICD-10-CM | POA: Diagnosis not present

## 2020-11-28 DIAGNOSIS — Z0000 Encounter for general adult medical examination without abnormal findings: Secondary | ICD-10-CM | POA: Diagnosis not present

## 2020-11-28 DIAGNOSIS — B370 Candidal stomatitis: Secondary | ICD-10-CM | POA: Diagnosis not present

## 2020-11-28 DIAGNOSIS — I10 Essential (primary) hypertension: Secondary | ICD-10-CM | POA: Diagnosis not present

## 2020-12-04 DIAGNOSIS — E291 Testicular hypofunction: Secondary | ICD-10-CM | POA: Diagnosis not present

## 2020-12-04 DIAGNOSIS — Z125 Encounter for screening for malignant neoplasm of prostate: Secondary | ICD-10-CM | POA: Diagnosis not present

## 2020-12-09 ENCOUNTER — Encounter: Payer: Self-pay | Admitting: Family Medicine

## 2020-12-09 ENCOUNTER — Ambulatory Visit: Payer: BC Managed Care – PPO | Admitting: Family Medicine

## 2020-12-09 VITALS — BP 158/96 | HR 106 | Ht 70.0 in | Wt 235.0 lb

## 2020-12-09 DIAGNOSIS — M542 Cervicalgia: Secondary | ICD-10-CM

## 2020-12-09 DIAGNOSIS — G4700 Insomnia, unspecified: Secondary | ICD-10-CM

## 2020-12-09 DIAGNOSIS — G478 Other sleep disorders: Secondary | ICD-10-CM | POA: Diagnosis not present

## 2020-12-09 MED ORDER — DIAZEPAM 10 MG PO TABS: ORAL_TABLET | ORAL | 1 refills | Status: DC

## 2020-12-09 MED ORDER — SERTRALINE HCL 100 MG PO TABS: 100.0000 mg | ORAL_TABLET | Freq: Every day | ORAL | 5 refills | Status: DC
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
## 2020-12-09 NOTE — Patient Instructions (Signed)
Below is our plan:  We will restart diazepam 5mg  in the mornings and 10mg  at bedtime. Discontinue alprazolam. Please monitor closely for any new or worsening symptoms. Consider looking at other neurofeedback providers as this was very helpful. [email or web address]  Please make sure you are staying well hydrated. I recommend 50-60 ounces daily. Well balanced diet and regular exercise encouraged. Consistent sleep schedule with 6-8 hours recommended.   Please continue follow up with care team as directed.   Follow up with Dr in 6 months   You may receive a survey regarding today's visit. I encourage you to leave honest feed back as I do use this information to improve patient care. Thank you for seeing me today!      Insomnia Insomnia is a sleep disorder that makes it difficult to fall asleep or stay asleep. Insomnia can cause fatigue, low energy, difficulty concentrating, mood swings, and poor performance at work or school. There are three different ways to classify insomnia:  Difficulty falling asleep.  Difficulty staying asleep.  Waking up too early in the morning. Any type of insomnia can be long-term (chronic) or short-term (acute). Both are common. Short-term insomnia usually lasts for three months or less. Chronic insomnia occurs at least three times a week for longer than three months. What are the causes? Insomnia may be caused by another condition, situation, or substance, such as:  Anxiety.  Certain medicines.  Gastroesophageal reflux disease (GERD) or other gastrointestinal conditions.  Asthma or other breathing conditions.  Restless legs syndrome, sleep apnea, or other sleep disorders.  Chronic pain.  Menopause.  Stroke.  Abuse of alcohol, tobacco, or illegal drugs.  Mental health conditions, such as depression.  Caffeine.  Neurological disorders, such as Alzheimer's disease.  An overactive thyroid (hyperthyroidism). Sometimes, the cause of  insomnia may not be known. What increases the risk? Risk factors for insomnia include:  Gender. Women are affected more often than men.  Age. Insomnia is more common as you get older.  Stress.  Lack of exercise.  Irregular work schedule or working night shifts.  Traveling between different time zones.  Certain medical and mental health conditions. What are the signs or symptoms? If you have insomnia, the main symptom is having trouble falling asleep or having trouble staying asleep. This may lead to other symptoms, such as:  Feeling fatigued or having low energy.  Feeling nervous about going to sleep.  Not feeling rested in the morning.  Having trouble concentrating.  Feeling irritable, anxious, or depressed. How is this diagnosed? This condition may be diagnosed based on:  Your symptoms and medical history. Your health care provider may ask about: ? Your sleep habits. ? Any medical conditions you have. ? Your mental health.  A physical exam. How is this treated? Treatment for insomnia depends on the cause. Treatment may focus on treating an underlying condition that is causing insomnia. Treatment may also include:  Medicines to help you sleep.  Counseling or therapy.  Lifestyle adjustments to help you sleep better. Follow these instructions at home: Eating and drinking  Limit or avoid alcohol, caffeinated beverages, and cigarettes, especially close to bedtime. These can disrupt your sleep.  Do not eat a large meal or eat spicy foods right before bedtime. This can lead to digestive discomfort that can make it hard for you to sleep.   Sleep habits  Keep a sleep diary to help you and your health care provider figure out what could be causing your insomnia. Write  down: ? When you sleep. ? When you wake up during the night. ? How well you sleep. ? How rested you feel the next day. ? Any side effects of medicines you are taking. ? What you eat and drink.  Make  your bedroom a dark, comfortable place where it is easy to fall asleep. ? Put up shades or blackout curtains to block light from outside. ? Use a white noise machine to block noise. ? Keep the temperature cool.  Limit screen use before bedtime. This includes: ? Watching TV. ? Using your smartphone, tablet, or computer.  Stick to a routine that includes going to bed and waking up at the same times every day and night. This can help you fall asleep faster. Consider making a quiet activity, such as reading, part of your nighttime routine.  Try to avoid taking naps during the day so that you sleep better at night.  Get out of bed if you are still awake after 15 minutes of trying to sleep. Keep the lights down, but try reading or doing a quiet activity. When you feel sleepy, go back to bed.   General instructions  Take over-the-counter and prescription medicines only as told by your health care provider.  Exercise regularly, as told by your health care provider. Avoid exercise starting several hours before bedtime.  Use relaxation techniques to manage stress. Ask your health care provider to suggest some techniques that may work well for you. These may include: ? Breathing exercises. ? Routines to release muscle tension. ? Visualizing peaceful scenes.  Make sure that you drive carefully. Avoid driving if you feel very sleepy.  Keep all follow-up visits as told by your health care provider. This is important. Contact a health care provider if:  You are tired throughout the day.  You have trouble in your daily routine due to sleepiness.  You continue to have sleep problems, or your sleep problems get worse. Get help right away if:  You have serious thoughts about hurting yourself or someone else. If you ever feel like you may hurt yourself or others, or have thoughts about taking your own life, get help right away. You can go to your nearest emergency department or call:  Your local  emergency services (911 in the [place]  A suicide crisis helpline, such as the National Suicide Prevention Lifeline at 4012683187. This is open 24 hours a day. Summary  Insomnia is a sleep disorder that makes it difficult to fall asleep or stay asleep.  Insomnia can be long-term (chronic) or short-term (acute).  Treatment for insomnia depends on the cause. Treatment may focus on treating an underlying condition that is causing insomnia.  Keep a sleep diary to help you and your health care provider figure out what could be causing your insomnia. This information is not intended to replace advice given to you by your health care provider. Make sure you discuss any questions you have with your health care provider. Document Revised: 08/01/2020 Document Reviewed: 08/01/2020 Elsevier Patient Education  2021 [employer].

## 2020-12-11 ENCOUNTER — Encounter: Payer: Self-pay | Admitting: Family Medicine

## 2020-12-11 DIAGNOSIS — E291 Testicular hypofunction: Secondary | ICD-10-CM | POA: Diagnosis not present

## 2020-12-11 DIAGNOSIS — N5201 Erectile dysfunction due to arterial insufficiency: Secondary | ICD-10-CM | POA: Diagnosis not present

## 2020-12-12 ENCOUNTER — Encounter: Payer: Self-pay | Admitting: Neurology

## 2020-12-12 ENCOUNTER — Encounter: Payer: Self-pay | Admitting: *Deleted

## 2020-12-13 ENCOUNTER — Encounter: Payer: Self-pay | Admitting: Family Medicine

## 2020-12-13 ENCOUNTER — Encounter: Payer: Self-pay | Admitting: Neurology

## 2020-12-13 MED ORDER — DAYVIGO 5 MG PO TABS: ORAL_TABLET | ORAL | 2 refills | Status: DC

## 2020-12-13 NOTE — Telephone Encounter (Signed)
As usual , we will need a PA - I printed a prescription for Marlana Latus, RN to work on.   I checked the list of medications that have failed to treat insomnia, as well adding the psychological therapy you had undergone .  Starting at 5 mg. Metabolized through the liver, smaller portion through kidney.  CD

## 2020-12-16 ENCOUNTER — Telehealth: Payer: Self-pay | Admitting: Neurology

## 2020-12-16 ENCOUNTER — Other Ambulatory Visit: Payer: Self-pay | Admitting: Neurology

## 2020-12-16 MED ORDER — DAYVIGO 5 MG PO TABS: 5.0000 mg | ORAL_TABLET | Freq: Every evening | ORAL | 2 refills | Status: DC

## 2020-12-16 NOTE — Telephone Encounter (Signed)
PA completed through cover my meds.  KQA:SUORVIFB May take up to 72 hrs before getting a response

## 2020-12-17 ENCOUNTER — Encounter: Payer: Self-pay | Admitting: Neurology

## 2020-12-17 NOTE — Telephone Encounter (Signed)
PA approved-Effective from 12/16/2020 through 12/15/2021.

## 2020-12-19 DIAGNOSIS — M961 Postlaminectomy syndrome, not elsewhere classified: Secondary | ICD-10-CM | POA: Diagnosis not present

## 2020-12-19 DIAGNOSIS — G894 Chronic pain syndrome: Secondary | ICD-10-CM | POA: Diagnosis not present

## 2020-12-25 ENCOUNTER — Other Ambulatory Visit: Payer: Self-pay | Admitting: Neurology

## 2020-12-25 DIAGNOSIS — M542 Cervicalgia: Secondary | ICD-10-CM | POA: Diagnosis not present

## 2020-12-25 DIAGNOSIS — M961 Postlaminectomy syndrome, not elsewhere classified: Secondary | ICD-10-CM | POA: Diagnosis not present

## 2020-12-25 DIAGNOSIS — G894 Chronic pain syndrome: Secondary | ICD-10-CM | POA: Diagnosis not present

## 2020-12-25 MED ORDER — DAYVIGO 10 MG PO TABS: 10.0000 mg | ORAL_TABLET | Freq: Every evening | ORAL | 5 refills | Status: DC

## 2021-01-02 DIAGNOSIS — Z5181 Encounter for therapeutic drug level monitoring: Secondary | ICD-10-CM | POA: Diagnosis not present

## 2021-01-02 DIAGNOSIS — Z79899 Other long term (current) drug therapy: Secondary | ICD-10-CM | POA: Diagnosis not present

## 2021-01-28 DIAGNOSIS — M961 Postlaminectomy syndrome, not elsewhere classified: Secondary | ICD-10-CM | POA: Diagnosis not present

## 2021-01-28 DIAGNOSIS — Z9689 Presence of other specified functional implants: Secondary | ICD-10-CM | POA: Diagnosis not present

## 2021-01-28 DIAGNOSIS — H9201 Otalgia, right ear: Secondary | ICD-10-CM | POA: Diagnosis not present

## 2021-01-28 DIAGNOSIS — G894 Chronic pain syndrome: Secondary | ICD-10-CM | POA: Diagnosis not present

## 2021-01-29 DIAGNOSIS — Z0271 Encounter for disability determination: Secondary | ICD-10-CM

## 2021-02-25 DIAGNOSIS — R1030 Lower abdominal pain, unspecified: Secondary | ICD-10-CM | POA: Diagnosis not present

## 2021-02-25 DIAGNOSIS — K5900 Constipation, unspecified: Secondary | ICD-10-CM | POA: Diagnosis not present

## 2021-02-25 DIAGNOSIS — M549 Dorsalgia, unspecified: Secondary | ICD-10-CM | POA: Diagnosis not present

## 2021-02-28 DIAGNOSIS — Z20828 Contact with and (suspected) exposure to other viral communicable diseases: Secondary | ICD-10-CM | POA: Diagnosis not present

## 2021-02-28 DIAGNOSIS — B370 Candidal stomatitis: Secondary | ICD-10-CM | POA: Diagnosis not present

## 2021-03-08 DIAGNOSIS — B370 Candidal stomatitis: Secondary | ICD-10-CM | POA: Diagnosis not present

## 2021-03-11 ENCOUNTER — Encounter: Payer: Self-pay | Admitting: Family Medicine

## 2021-03-12 ENCOUNTER — Other Ambulatory Visit: Payer: Self-pay | Admitting: Family Medicine

## 2021-03-12 ENCOUNTER — Encounter: Payer: Self-pay | Admitting: Family Medicine

## 2021-03-12 MED ORDER — BELSOMRA 20 MG PO TABS: 20.0000 mg | ORAL_TABLET | Freq: Every day | ORAL | 0 refills | Status: DC

## 2021-03-12 NOTE — Telephone Encounter (Signed)
I restarted Belsomra 20mg  as he feels that this is what you wanted him to try again. I am not sure what else to offer for him. Can you look at his chart and let me know if you have any other suggestions? TY!

## 2021-03-14 ENCOUNTER — Encounter: Payer: Self-pay | Admitting: Family Medicine

## 2021-03-14 NOTE — Telephone Encounter (Signed)
Belsomra is very safe, that is the great benefit. He has been doing well on Xanax XR 1 mg , later needed 2 mg, now that has been replaced by a higher dose of Diazepam, which had been taken for muscle tension before, per ortho- he was on opiates for many month ( [facility] ) and I see a refill for 3 mg LUNESTA by a [facility] prescriber.  [facility] gave him today Baclofen , another potential sedative.   He has been chronic insomniac for decades, and I see only one new options of medication : It is dangerous to add another sedative or hypnotic- Dayvigo blocks orexin (also called hypocretin), one of the main chemicals in the brain that regulates wakefulness. The difference is that Dayvigo and Belsomra work by blocking wakefulness rather than promoting sleepiness, and the newest in this family is Lennox Solders  I consider only Belsomra , Quviviq or Dayvigo safe in this mix, and if these don't help we have no more options.   He has failed, LUNESTA, AMBIEN, SEROQUEL, ATIVAN, XANAX and XANAX xr .

## 2021-03-14 NOTE — Addendum Note (Signed)
Addended by: Melvyn Novas on: 03/14/2021 11:48 AM   Modules accepted: Orders

## 2021-03-17 MED ORDER — DAYVIGO 10 MG PO TABS: ORAL_TABLET | ORAL | 1 refills | Status: DC

## 2021-03-17 MED ORDER — BELSOMRA 20 MG PO TABS: 20.0000 mg | ORAL_TABLET | Freq: Every day | ORAL | 0 refills | Status: DC

## 2021-03-17 NOTE — Telephone Encounter (Signed)
I have called it in. I am not comfortable with increased dose. He may talk to Dr Vickey Huger about increased dose.

## 2021-03-17 NOTE — Addendum Note (Signed)
Addended by: Guy Begin on: 03/17/2021 03:36 PM   Modules accepted: Orders

## 2021-03-18 ENCOUNTER — Encounter: Payer: Self-pay | Admitting: Neurology

## 2021-03-19 ENCOUNTER — Other Ambulatory Visit: Payer: Self-pay | Admitting: Neurology

## 2021-03-19 DIAGNOSIS — G4700 Insomnia, unspecified: Secondary | ICD-10-CM

## 2021-03-19 NOTE — Telephone Encounter (Signed)
Yes, 20 mg belsomra and 10 mg diazepam.

## 2021-03-24 ENCOUNTER — Telehealth: Payer: Self-pay

## 2021-03-24 DIAGNOSIS — J3489 Other specified disorders of nose and nasal sinuses: Secondary | ICD-10-CM | POA: Diagnosis not present

## 2021-03-24 DIAGNOSIS — B370 Candidal stomatitis: Secondary | ICD-10-CM | POA: Diagnosis not present

## 2021-03-24 DIAGNOSIS — K219 Gastro-esophageal reflux disease without esophagitis: Secondary | ICD-10-CM | POA: Diagnosis not present

## 2021-03-24 NOTE — Telephone Encounter (Signed)
Referral has been sent to Dr. Lucianne Muss at [facility]. Phone: [phone].

## 2021-03-26 ENCOUNTER — Encounter: Payer: Self-pay | Admitting: Neurology

## 2021-03-26 DIAGNOSIS — Z9689 Presence of other specified functional implants: Secondary | ICD-10-CM | POA: Diagnosis not present

## 2021-03-26 DIAGNOSIS — G894 Chronic pain syndrome: Secondary | ICD-10-CM | POA: Diagnosis not present

## 2021-03-27 ENCOUNTER — Encounter: Payer: Self-pay | Admitting: Neurology

## 2021-03-27 DIAGNOSIS — E291 Testicular hypofunction: Secondary | ICD-10-CM | POA: Diagnosis not present

## 2021-03-28 DIAGNOSIS — B370 Candidal stomatitis: Secondary | ICD-10-CM | POA: Diagnosis not present

## 2021-03-28 DIAGNOSIS — G894 Chronic pain syndrome: Secondary | ICD-10-CM | POA: Diagnosis not present

## 2021-03-31 DIAGNOSIS — G4733 Obstructive sleep apnea (adult) (pediatric): Secondary | ICD-10-CM | POA: Diagnosis not present

## 2021-04-01 ENCOUNTER — Other Ambulatory Visit: Payer: Self-pay | Admitting: Neurology

## 2021-04-01 DIAGNOSIS — G4700 Insomnia, unspecified: Secondary | ICD-10-CM

## 2021-04-01 DIAGNOSIS — R0683 Snoring: Secondary | ICD-10-CM

## 2021-04-02 ENCOUNTER — Other Ambulatory Visit: Payer: Self-pay | Admitting: Neurology

## 2021-04-02 ENCOUNTER — Encounter: Payer: Self-pay | Admitting: Neurology

## 2021-04-04 DIAGNOSIS — E291 Testicular hypofunction: Secondary | ICD-10-CM | POA: Diagnosis not present

## 2021-04-08 ENCOUNTER — Encounter: Payer: Self-pay | Admitting: Neurology

## 2021-04-09 DIAGNOSIS — G4733 Obstructive sleep apnea (adult) (pediatric): Secondary | ICD-10-CM | POA: Diagnosis not present

## 2021-04-18 DIAGNOSIS — B370 Candidal stomatitis: Secondary | ICD-10-CM | POA: Diagnosis not present

## 2021-05-09 DIAGNOSIS — K5900 Constipation, unspecified: Secondary | ICD-10-CM | POA: Diagnosis not present

## 2021-05-09 DIAGNOSIS — R140 Abdominal distension (gaseous): Secondary | ICD-10-CM | POA: Diagnosis not present

## 2021-05-13 ENCOUNTER — Other Ambulatory Visit (HOSPITAL_COMMUNITY): Payer: Self-pay

## 2021-05-13 ENCOUNTER — Ambulatory Visit (HOSPITAL_COMMUNITY): Payer: BC Managed Care – PPO | Admitting: Infectious Disease

## 2021-05-13 ENCOUNTER — Other Ambulatory Visit: Payer: Self-pay

## 2021-05-13 ENCOUNTER — Encounter: Payer: Self-pay | Admitting: Infectious Disease

## 2021-05-13 VITALS — Wt 240.0 lb

## 2021-05-13 DIAGNOSIS — E291 Testicular hypofunction: Secondary | ICD-10-CM | POA: Insufficient documentation

## 2021-05-13 DIAGNOSIS — T50B95A Adverse effect of other viral vaccines, initial encounter: Secondary | ICD-10-CM

## 2021-05-13 DIAGNOSIS — Z9689 Presence of other specified functional implants: Secondary | ICD-10-CM

## 2021-05-13 DIAGNOSIS — R7401 Elevation of levels of liver transaminase levels: Secondary | ICD-10-CM | POA: Diagnosis not present

## 2021-05-13 DIAGNOSIS — M542 Cervicalgia: Secondary | ICD-10-CM | POA: Diagnosis not present

## 2021-05-13 DIAGNOSIS — K141 Geographic tongue: Secondary | ICD-10-CM

## 2021-05-13 HISTORY — DX: Geographic tongue: K14.1

## 2021-05-13 HISTORY — DX: Presence of other specified functional implants: Z96.89

## 2021-05-13 HISTORY — DX: Adverse effect of other viral vaccines, initial encounter: T50.B95A

## 2021-05-13 HISTORY — DX: Elevation of levels of liver transaminase levels: R74.01

## 2021-05-13 NOTE — Progress Notes (Signed)
Subjective:  Reason for Infectious Disease Consult: Oral lesions thought to be refractory thrush  Requesting Physician: Tally Joe, MD and Carylon Perches, DO   Patient ID: [name], male    DOB: Mar 07, 1971, 50 y.o.   MRN: [id number]  HPI  [name] is a 50 year old black man with past medical history significant for degenerative disc disease hypergonadism hypertension who was referred to [place] for what was thought to be refractory thrush.  He tells me that he began having problems in June 2021.  He had had a recent C-spine surgery from Dr. Shon Baton on his C6-C7 spine with arthroplasty via anterior approach (though I am confused now that I look at the chart because there is mention of surgery in 2018 by Dr. Shon Baton)  He thereafter he was vaccinated against COVID-19 but developed what he believes to be a severe adverse reaction to the vaccine.  He apparently developed involuntary twitching involving his shoulder which would last for about 15 seconds at a time.  It is worsened over the ensuing 3 weeks.  He was seen in the ER on June 3rd and evaluated by neurology.  They at the time suspected potential cervical spine spasms versus psychogenic etiology.   Then was seen in the ER on July 13 after he had received his second COVID-19 vaccine vaccination.  He had had symptoms of fever body aches difficulty walking.  Fever subsided after 3 days but he continued have pain in hands and his feet.  He was prescribed prednisone and Benadryl and gabapentin.  ER he was given Solu-Medrol and continued on prednisone at that time.  He apparently experienced exfoliation of skin on his hands and feet at this time.  He developed what sounds like could have been thrush since it did respond to fluconazole but curiously did not to nystatin.  He also had a spinal cord stimulator placed in March due to ongoing pain He was given antibiotics preoperatively and postoperatively.  After finishing antibiotics  were given to him with implantation of a temporary and then a permanent spinal cord stimulator he developed oral lesions again.  He was given fluconazole but this did not improve his symptoms at all.  He was then prescribed clotrimazole which also made no impact.  He was ultimately referred to ear nose and throat and seen by Dr. Marene Lenz who performed endoscopy.  She found white coating on his tongue but no exudate on throat or in the esophagus.  He initiated voriconazole for the patient.  Voriconazole has made no difference in his tongue coating either.  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  He certainly cannot have thrush (candida) causing his oral lesions as they have consistently shown no response to antifungal therapy.  He is not immunocompromised and even in her into An O compromised patients it takes them typically months if not years to develop resistance to azoles.  Recently took a probiotic that he was prescribed by a gastroenterologist with the idea that this would help with t his oral lesions and he believes it has helped somewhat though I think this is more likely coincidence I do not see a mechanism for why this would help.   He has had some tenderness around his spinal stimulator battery site which I have examined posteriorly.   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   H/O degenerative disc disease    H/O laminectomy    lumbar, 2009   Herniated disc, cervical    L4,L5   Herniation of cervical intervertebral disc with radiculopathy    High cholesterol    Hypercholesteremia    Hypertension    Hypogonadism, male    Insomnia    PTSD   Low testosterone    Migraine    once monthly   Spinal cord stimulator status 05/13/2021   Transaminitis 05/13/2021    Past Surgical History:  Procedure  Laterality Date   BACK SURGERY     diskectomy   CERVICAL DISC ARTHROPLASTY N/A 02/24/2017   Procedure: CERVICAL ANTERIOR DISC ARTHROPLASTY [name];  Surgeon: Venita Lick, MD;  Location: [facility] OR;  Service: Orthopedics;  Laterality: N/A;  3 hrs   LAMINECTOMY  2009   LUMBAR DISC SURGERY  2009   SEPTOPLASTY      Family History  Problem Relation Age of Onset   Diabetes Mother    Hypertension Mother    Stroke Mother    Diabetes Father    Migraines Sister        3 sisters      Social History   Socioeconomic History   Marital status: Married    Spouse name: Not on file   Number of children: Not on file   Years of education: Not on file   Highest education level: Not on file  Occupational History   Occupation: Medical illustrator: [employer]    Comment: has an associate degree  Tobacco Use   Smoking status: Never   Smokeless tobacco: Never  Vaping Use   Vaping Use: Never used  Substance and Sexual Activity   Alcohol use: Yes    Comment: occasionally, one glass of wine monthly/ week   Drug use: No   Sexual activity: Not on file  Other Topics Concern   Not on file  Social History Narrative   Not on file   Social Determinants of Health   Financial Resource Strain: Not on file  Food Insecurity: Not on file  Transportation Needs: Not on file  Physical Activity: Not on file  Stress: Not on file  Social Connections: Not on file    Allergies  Allergen Reactions   Losartan Shortness Of Breath and Palpitations   Alprazolam Other (See Comments)   Hydrocodone-Acetaminophen Other (See Comments)   Tiagabine     Other reaction(s): Other   Covid-19 Mrna Vacc (Moderna) Other (See Comments)   Percocet [Oxycodone-Acetaminophen] Hives     Current Outpatient Medications:    amLODipine (NORVASC) 10 MG tablet, Take 10 mg by mouth at bedtime. , Disp: , Rfl:    Buprenorphine HCl (BELBUCA BU), Place inside cheek., Disp: , Rfl:    DAYVIGO 10 MG TABS, 10 mg dayvigo  at bedtime po., Disp: 30 tablet, Rfl: 1   diazepam (VALIUM) 10 MG tablet, Take 5mg  in the morning and 10mg  at bedtime, Disp: 135 tablet, Rfl: 1   hydrochlorothiazide (HYDRODIURIL) 25 MG tablet, Take 25 mg by mouth daily., Disp: , Rfl:    rosuvastatin (CRESTOR) 5 MG tablet, Take 5 mg by mouth daily with supper., Disp: , Rfl:    sertraline (ZOLOFT) 100 MG tablet, Take 1 tablet (100 mg total) by mouth daily., Disp: 30 tablet, Rfl: 5   Suvorexant (BELSOMRA) 20 MG TABS, Take 20 mg by mouth at bedtime., Disp: 30 tablet, Rfl: 0  testosterone cypionate (DEPOTESTOTERONE CYPIONATE) 100 MG/ML injection, Inject 100 mg into the muscle every 7 (seven) days. Takes on Fridays. For IM use only., Disp: , Rfl:    Review of Systems  Constitutional:  Negative for chills and fever.  HENT:  Negative for congestion and sore throat.   Eyes:  Negative for photophobia.  Respiratory:  Negative for cough, shortness of breath and wheezing.   Cardiovascular:  Negative for chest pain, palpitations and leg swelling.  Gastrointestinal:  Positive for abdominal distention and abdominal pain. Negative for blood in stool, constipation, diarrhea, nausea and vomiting.  Genitourinary:  Negative for dysuria, flank pain and hematuria.  Musculoskeletal:  Negative for back pain and myalgias.  Skin:  Negative for rash.  Neurological:  Positive for numbness. Negative for dizziness, weakness and headaches.  Hematological:  Does not bruise/bleed easily.  Psychiatric/Behavioral:  Negative for agitation, behavioral problems, confusion and suicidal ideas. The patient is not hyperactive.       Objective:   Physical Exam Constitutional:      Appearance: He is well-developed.  HENT:     Head: Normocephalic and atraumatic.     Mouth/Throat:     Lips: Pink.     Mouth: Mucous membranes are moist.     Comments: Lesions consistent with geographic tongue see picture Eyes:     Extraocular Movements: Extraocular movements intact.      Conjunctiva/sclera: Conjunctivae normal.     Pupils: Pupils are equal, round, and reactive to light.  Cardiovascular:     Rate and Rhythm: Normal rate and regular rhythm.  Pulmonary:     Effort: Pulmonary effort is normal. No respiratory distress.     Breath sounds: No wheezing.  Abdominal:     General: There is no distension.     Palpations: Abdomen is soft.  Musculoskeletal:        General: No tenderness. Normal range of motion.     Cervical back: Normal range of motion and neck supple.  Skin:    General: Skin is warm and dry.     Coloration: Skin is not pale.     Findings: No erythema or rash.  Neurological:     General: No focal deficit present.     Mental Status: He is alert and oriented to person, place, and time.  Psychiatric:        Mood and Affect: Mood normal.        Behavior: Behavior normal.        Thought Content: Thought content normal.        Judgment: Judgment normal.   Vertical sites are clean he does have an area on his right back where the generator is for his spinal cord stimulator that he says some tenderness times that was not tender to my exam today.    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  He has proven that by his failure to respond to potent antifungal therapy including nystatin or recently fluconazole and voriconazole.  He is not immunocompromised and even if he was this would be too soon to start seeing azole resistance.  I offered HIV testing but he has had HIV test which was negative.  Not have a history of STIs and he is monogamous with his wife and believes that she is the same way with him.  He  needs to be managed symptomatically.  Adverse reaction to COVID-19 vaccine: This seems to have resolved and the story is fairly complicated.  Spinal cord stimulator in place will still was some  tenderness 4 months after implantation.  He is going to follow-up with his pain specialist to implanted this.  Hopefully has not become infected if it has then he may need surgery and preferably cultures taken in the oR  Cervical spine disease: Following with Dr. Shon Baton but also with pain specialist inserted the spinal cord stimulator.  He can return to clinic to see me as needed.  I spent 82 minutes with the patient including f> 50% of time in ace to face counseling of the patient guarding the differential diagnosis for oral lesions including thrush but explaining why his oral lesions are not thrush due to their lack of response to antifungal therapy, reviewing the nature of geographic tongue, reading his CT scan of C-spine from November 03, 2020 in which he had hardware present already in the spine and also a subdural hemorrhage and an area in the C5 vertebral body thought to be possible fracture, in review of his records from ER multiple visits at [facility], with his ENT MD, his primary care physician  before and during the visit and in coordination of his care.

## 2021-05-15 DIAGNOSIS — E291 Testicular hypofunction: Secondary | ICD-10-CM | POA: Diagnosis not present

## 2021-05-15 DIAGNOSIS — Z125 Encounter for screening for malignant neoplasm of prostate: Secondary | ICD-10-CM | POA: Diagnosis not present

## 2021-05-22 DIAGNOSIS — E291 Testicular hypofunction: Secondary | ICD-10-CM | POA: Diagnosis not present

## 2021-05-22 DIAGNOSIS — N5201 Erectile dysfunction due to arterial insufficiency: Secondary | ICD-10-CM | POA: Diagnosis not present

## 2021-05-27 ENCOUNTER — Encounter: Payer: Self-pay | Admitting: Neurology

## 2021-05-27 ENCOUNTER — Other Ambulatory Visit: Payer: Self-pay | Admitting: *Deleted

## 2021-06-11 DIAGNOSIS — G894 Chronic pain syndrome: Secondary | ICD-10-CM | POA: Diagnosis not present

## 2021-06-11 DIAGNOSIS — M961 Postlaminectomy syndrome, not elsewhere classified: Secondary | ICD-10-CM | POA: Diagnosis not present

## 2021-06-12 ENCOUNTER — Encounter: Payer: Self-pay | Admitting: Neurology

## 2021-06-12 ENCOUNTER — Other Ambulatory Visit: Payer: Self-pay

## 2021-06-12 ENCOUNTER — Ambulatory Visit (INDEPENDENT_AMBULATORY_CARE_PROVIDER_SITE_OTHER): Payer: BC Managed Care – PPO | Admitting: Neurology

## 2021-06-12 VITALS — BP 152/95 | HR 80 | Ht 70.0 in | Wt 240.5 lb

## 2021-06-12 DIAGNOSIS — F5104 Psychophysiologic insomnia: Secondary | ICD-10-CM | POA: Diagnosis not present

## 2021-06-12 DIAGNOSIS — M542 Cervicalgia: Secondary | ICD-10-CM

## 2021-06-12 DIAGNOSIS — F410 Panic disorder [episodic paroxysmal anxiety] without agoraphobia: Secondary | ICD-10-CM

## 2021-06-12 MED ORDER — DIAZEPAM 5 MG PO TABS
5.0000 mg | ORAL_TABLET | Freq: Four times a day (QID) | ORAL | 0 refills | Status: DC | PRN
Start: 2021-06-12 — End: 2021-07-09

## 2021-06-12 MED ORDER — ESZOPICLONE 2 MG PO TABS: 2.0000 mg | ORAL_TABLET | Freq: Every evening | ORAL | 5 refills | Status: DC | PRN

## 2021-06-12 NOTE — Patient Instructions (Addendum)
Eszopiclone tablets What is this medication? ESZOPICLONE (es ZOE pi clone) is used to treat insomnia. This medicine helps you to fall asleep and sleep through the night. This medicine may be used for other purposes; ask your health care provider or pharmacist if you have questions. COMMON BRAND NAME(S): Lunesta What should I tell my care team before I take this medication? They need to know if you have any of these conditions: depression history of a drug or alcohol abuse problem liver disease lung or breathing disease sleep-walking, driving, eating or other activity while not fully awake after taking a sleep medicine suicidal thoughts an unusual or allergic reaction to eszopiclone, other medicines, foods, dyes, or preservatives pregnant or trying to get pregnant breast-feeding How should I use this medication? Take this medicine by mouth with a glass of water. Follow the directions on the prescription label. It is better to take this medicine on an empty stomach and only when you are ready for bed. Do not take your medicine more often than directed. If you have been taking this medicine for several weeks and suddenly stop taking it, you may get unpleasant withdrawal symptoms. Your doctor or health care professional may want to gradually reduce the dose. Do not stop taking this medicine on your own. Always follow your doctor or health care professional's advice. Talk to your pediatrician regarding the use of this medicine in children. Special care may be needed. Overdosage: If you think you have taken too much of this medicine contact a poison control center or emergency room at once. NOTE: This medicine is only for you. Do not share this medicine with others. What if I miss a dose? This does not apply. This medicine should only be taken immediately before going to sleep. Do not take double or extra doses. What may interact with this medication? herbal medicines like kava kava, melatonin, St.  John's wort and valerian lorazepam medicines for fungal infections like ketoconazole, fluconazole, or itraconazole olanzapine This list may not describe all possible interactions. Give your health care provider a list of all the medicines, herbs, non-prescription drugs, or dietary supplements you use. Also tell them if you smoke, drink alcohol, or use illegal drugs. Some items may interact with your medicine. What should I watch for while using this medication? Visit your doctor or health care professional for regular checks on your progress. Keep a regular sleep schedule by going to bed at about the same time nightly. Avoid caffeine-containing drinks in the evening hours, as caffeine can cause trouble with falling asleep. Talk to your doctor if you still have trouble sleeping. After taking this medicine, you may get up out of bed and do an activity that you do not know you are doing. The next morning, you may have no memory of this. Activities include driving a car ("sleep-driving"), making and eating food, talking on the phone, sexual activity, and sleep-walking. Serious injuries have occurred. Stop the medicine and call your doctor right away if you find out you have done any of these activities. Do not take this medicine if you have used alcohol that evening. Do not take it if you have taken another medicine for sleep. The risk of doing these sleep-related activities is higher. Do not take this medicine unless you are able to stay in bed for a full night (7 to 8 hours) before you must be active again. You may have a decrease in mental alertness the day after use, even if you feel that you  are fully awake. Tell your doctor if you will need to perform activities requiring full alertness, such as driving, the next day. Do not stand or sit up quickly after taking this medicine, especially if you are an older patient. This reduces the risk of dizzy or fainting spells. If you or your family notice any changes  in your behavior, such as new or worsening depression, thoughts of harming yourself, anxiety, other unusual or disturbing thoughts, or memory loss, call your doctor right away. After you stop taking this medicine, you may have trouble falling asleep. This is called rebound insomnia. This problem usually goes away on its own after 1 or 2 nights. What side effects may I notice from receiving this medication? Side effects that you should report to your doctor or health care professional as soon as possible: allergic reactions like skin rash, itching or hives, swelling of the face, lips, or tongue changes in vision confusion depressed mood feeling faint or lightheaded, falls hallucinations problems with balance, speaking, walking restlessness, excitability, or feelings of agitation unusual activities while not fully awake like driving, eating, making phone calls Side effects that usually do not require medical attention (report to your doctor or health care professional if they continue or are bothersome): dizziness, or daytime drowsiness, sometimes called a hangover effect headache This list may not describe all possible side effects. Call your doctor for medical advice about side effects. You may report side effects to FDA at 1-800-FDA-1088. Where should I keep my medication? Keep out of the reach of children. This medicine can be abused. Keep your medicine in a safe place to protect it from theft. Do not share this medicine with anyone. Selling or giving away this medicine is dangerous and against the law. This medicine may cause accidental overdose and death if taken by other adults, children, or pets. Mix any unused medicine with a substance like cat litter or coffee grounds. Then throw the medicine away in a sealed container like a sealed bag or a coffee can with a lid. Do not use the medicine after the expiration date. Store at room temperature between 15 and 30 degrees C (59 and 86 degrees  F). NOTE: This sheet is a summary. It may not cover all possible information. If you have questions about this medicine, talk to your doctor, pharmacist, or health care provider.  2022 Elsevier/Gold Standard (2018-03-18 11:57:05) Lemborexant Oral Tablets What is this medication? LEMBOREXANT (LEM boe REX ant) is used to treat insomnia. It helps you go to sleep faster and stay asleep through the night. This medicine may be used for other purposes; ask your health care provider or pharmacist if you have questions. COMMON BRAND NAME(S): DAYVIGO What should I tell my care team before I take this medication? They need to know if you have any of these conditions: Depression History of drug abuse or addiction History of a sudden onset of muscle weakness (cataplexy) History of falling asleep often at unexpected times (narcolepsy) If you often drink alcohol Liver disease Lung or breathing disease Sleep apnea Sleep-walking, driving, eating or other activity while not fully awake after taking a sleep medication Suicidal thoughts, plans, or attempt; a previous suicide attempt by you or a family member An unusual or allergic reaction to lemborexant, other medications, foods, dyes, or preservatives Pregnant or trying to get pregnant Breast-feeding How should I use this medication? Take this medication by mouth with water right before going to bed. Follow the directions on the prescription label. It  is better to take this medication on an empty stomach. Do not take your medication more often than directed. A special MedGuide will be given to you by the pharmacist with each prescription and refill. Be sure to read this information carefully each time. Talk to your care team about the use of this medication in children. Special care may be needed. Overdosage: If you think you have taken too much of this medicine contact a poison control center or emergency room at once. NOTE: This medicine is only for  you. Do not share this medicine with others. What if I miss a dose? This does not apply. This medication should only be taken immediately before going to sleep. Do not take double or extra doses. What may interact with this medication? Alcohol Antihistamines for allergy, cough, or cold Bosentan Bupropion Certain antibiotics like erythromycin or clarithromycin Certain antivirals for HIV or hepatitis Certain medicines for depression, anxiety, or psychotic disorders Certain medicines for fungal infections like fluconazole, itraconazole, ketoconazole, posaconazole, voriconazole Certain medicines for seizures like carbamazepine, phenytoin, phenobarbital Chlorzoxazone Diltiazem General anesthetics like halothane, isoflurane, methoxyflurane, propofol Grapefruit juice Medicines that relax muscles for surgery Modafinil Narcotic medicines for pain Other medicines for sleep Ranitidine Rifampin St. John's wort Verapamil This list may not describe all possible interactions. Give your health care provider a list of all the medicines, herbs, non-prescription drugs, or dietary supplements you use. Also tell them if you smoke, drink alcohol, or use illegal drugs. Some items may interact with your medicine. What should I watch for while using this medication? Visit your care team for regular checks on your progress. Keep a regular sleep schedule by going to bed at about the same time each night. Avoid caffeine-containing drinks in the evening hours. Talk to your care team if your insomnia worsens or is not better within 7 to 10 days. After taking this medication, you may get up out of bed and do an activity that you do not know you are doing. The next morning, you may have no memory of this. Activities include driving a car ("sleep-driving"), making and eating food, talking on the phone, sexual activity, and sleep-walking. Serious injuries have occurred. Stop the medication and call your care team right  away if you find out you have done any of these activities. Do not take this medication if you have used alcohol that evening. Do not take it if you have taken another medication for sleep. The risk of doing these sleep-related activities is higher. Do not take this medication unless you are able to stay in bed for a full night (7 to 8 hours) before you must be active again. Tell your care team if you will need to perform activities requiring full alertness, such as driving, the next day. You may have a decrease in mental alertness the day after use, even if you feel that you are fully awake. Do not stand or sit up quickly after taking this medication, especially if you are an older patient. This reduces the risk of dizzy or fainting spells. If you or your family notice any changes in your moods or behavior, such as new or worsening depression, thoughts of harming yourself, anxiety, other unusual or disturbing thoughts, or memory loss, call your care team right away. After you stop taking this medication, you may have trouble falling asleep. This is called rebound insomnia. This problem usually goes away on its own after 1 or 2 nights. What side effects may I notice from receiving  this medication? Side effects that you should report to your care team as soon as possible: Allergic reactions-skin rash, itching, hives, swelling of the face, lips, tongue, or throat CNS depression-slow or shallow breathing, shortness of breath, feeling faint, dizziness, confusion, difficulty staying awake Mood and behavior changes-anxiety, nervousness, confusion, hallucinations, irritability, hostility, thoughts of suicide or self-harm, worsening mood, feelings of depression Temporary inability to move or talk for up to several minutes while you are going to sleep or waking up Unusual sleep behaviors or activities you do not remember such as driving, eating, or sexual activity Side effects that usually do not require medical  attention (report these to your care team if they continue or are bothersome): Drowsiness the day after use Headache This list may not describe all possible side effects. Call your doctor for medical advice about side effects. You may report side effects to FDA at 1-800-FDA-1088. Where should I keep my medication? Keep out of the reach of children and pets. This medication can be abused. Keep it in a safe place to protect it from theft. Do not share it with anyone. It is only for you. Selling or giving away this medication is dangerous and against the law. Store between 20 and 25 degrees C (68 and 77 degrees F). Get rid of any unused medication after the expiration date. This medication may cause harm and death if it is taken by other adults, children, or pets. It is important to get rid of the medication as soon as you no longer need it or it is expired. You can do this in two ways: Take the medication to a medication take-back program. Check with your pharmacy or law enforcement to find a location. If you cannot return the medication, check the label or package insert to see if the medication should be thrown out in the garbage or flushed down the toilet. If you are not sure, ask your care team. If it is safe to put it in the trash, take the medication out of the container. Mix the medication with cat litter, dirt, coffee grounds, or other unwanted substance. Seal the mixture in a bag or container. Put it in the trash. NOTE: This sheet is a summary. It may not cover all possible information. If you have questions about this medicine, talk to your doctor, pharmacist, or health care provider.  2022 Elsevier/Gold Standard (2020-12-03 16:15:22)
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
## 2021-06-19 ENCOUNTER — Encounter: Payer: Self-pay | Admitting: Neurology

## 2021-06-23 ENCOUNTER — Other Ambulatory Visit: Payer: Self-pay | Admitting: Family Medicine

## 2021-06-23 MED ORDER — ESZOPICLONE 3 MG PO TABS: 3.0000 mg | ORAL_TABLET | Freq: Every evening | ORAL | 1 refills | Status: DC | PRN

## 2021-07-03 DIAGNOSIS — Z5181 Encounter for therapeutic drug level monitoring: Secondary | ICD-10-CM | POA: Diagnosis not present

## 2021-07-03 DIAGNOSIS — M542 Cervicalgia: Secondary | ICD-10-CM | POA: Diagnosis not present

## 2021-07-03 DIAGNOSIS — Z79899 Other long term (current) drug therapy: Secondary | ICD-10-CM | POA: Diagnosis not present

## 2021-07-03 IMAGING — CT CT HEAD W/O CM
4 series · 15 of 47 positions shown, 17 images · non-contrast
Comparison: None.

CLINICAL DATA: Neck trauma, fall down stairs this morning, possible
loss of consciousness, neck pain.

EXAM:
CT HEAD WITHOUT CONTRAST
CT CERVICAL SPINE WITHOUT CONTRAST
TECHNIQUE: Multidetector CT imaging of the head and cervical spine was
performed following the standard protocol without intravenous
contrast. Multiplanar CT image reconstructions of the cervical spine
were also generated.

[Series 3: head without · axial · non-contrast · 0.48mm/px · z∈[-110,+5]mm · 6 of 33 slices shown, 8 images]
[im 5/33  brain]
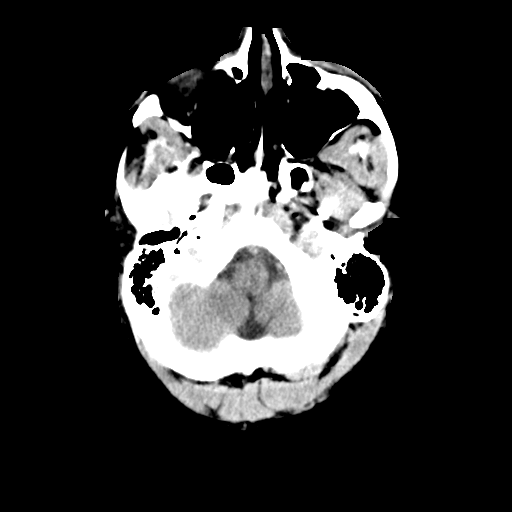
[im 5/33  bone]
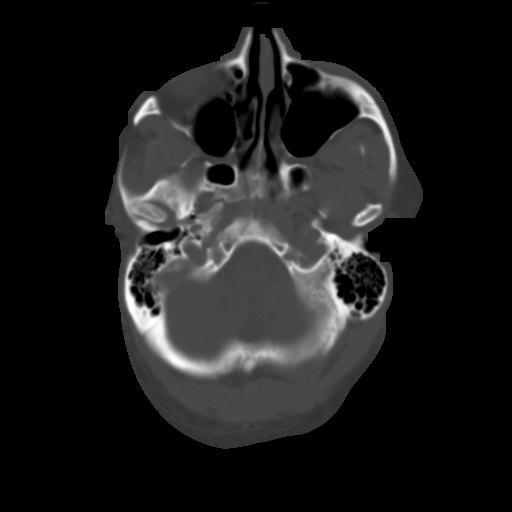
[im 10/33  brain]
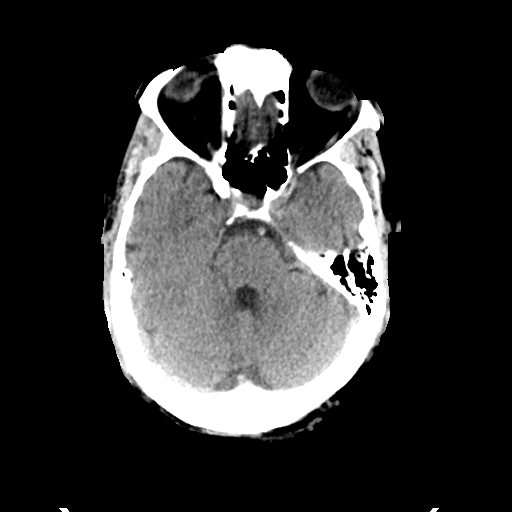
[im 14/33  brain]
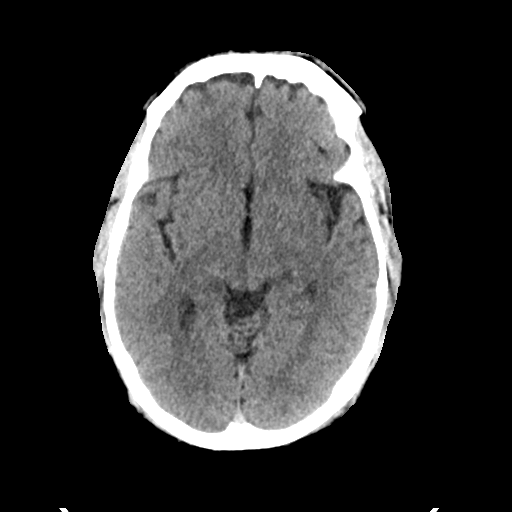
[im 19/33  brain]
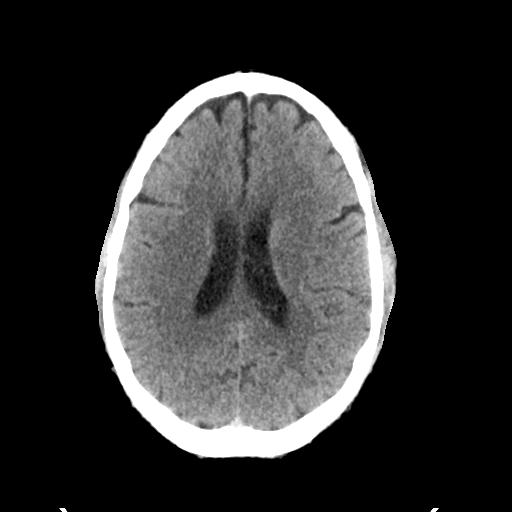
[im 23/33  brain]
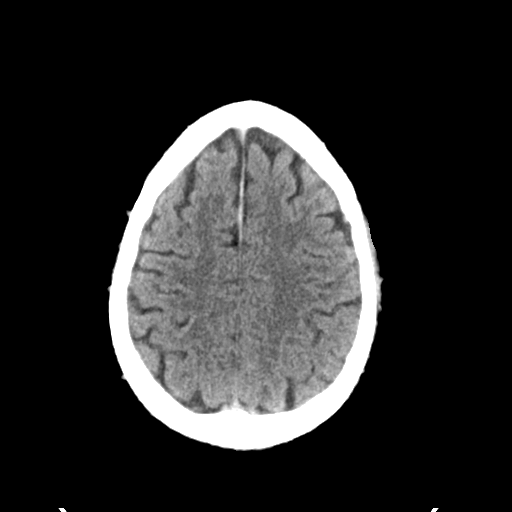
[im 23/33  bone]
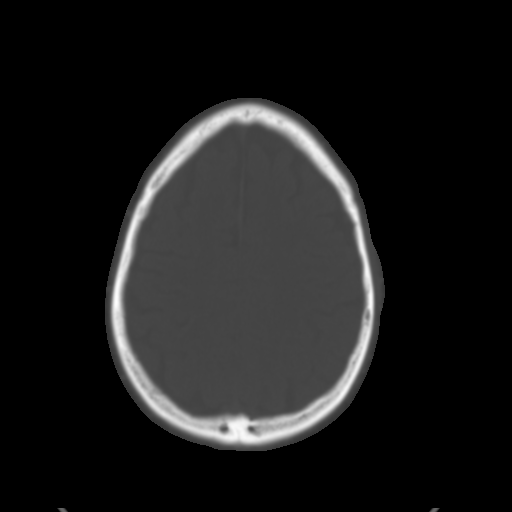
[im 28/33  brain]
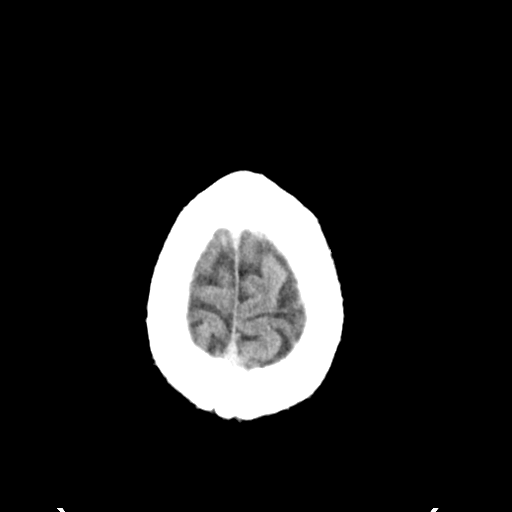

[Series 4: head bone · axial · 0.48mm/px · z∈[-116,-76]mm · 3 of 84 slices shown]
[im 8/84  bone]
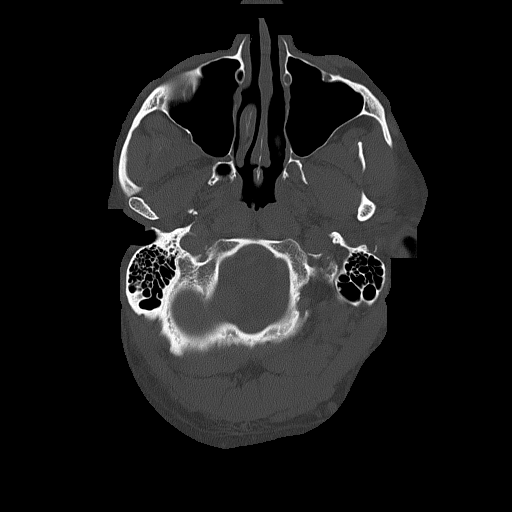
[im 16/84  bone]
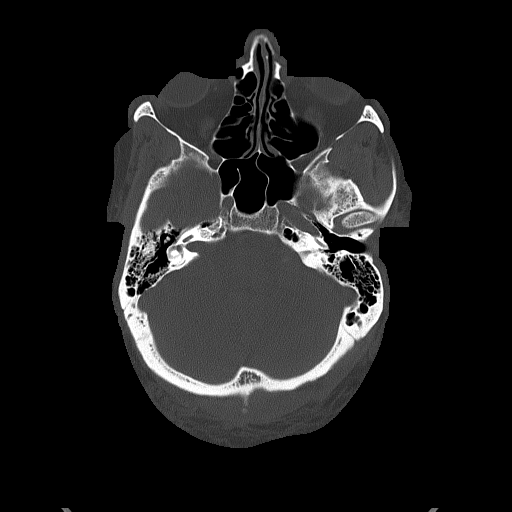
[im 28/84  bone]
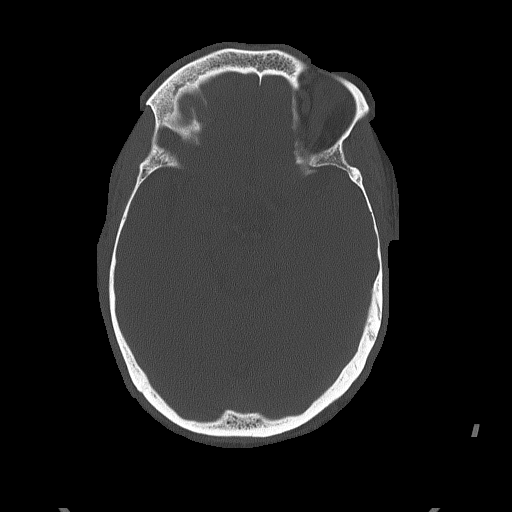

[Series 5: head without cor · coronal · non-contrast · 0.36mm/px · 3 of 75 slices shown]
[im 25/75  brain]
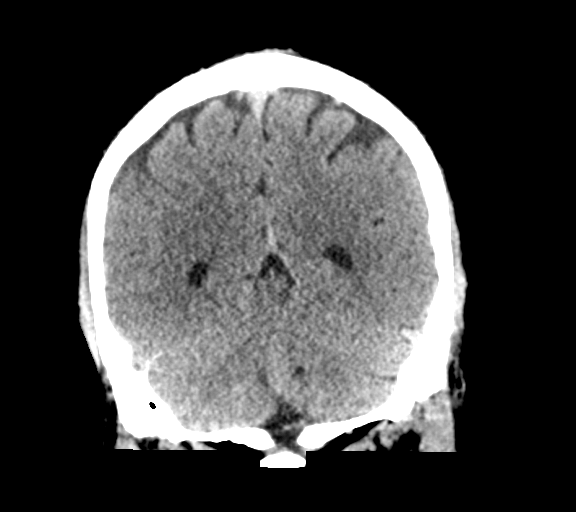
[im 33/75  brain]
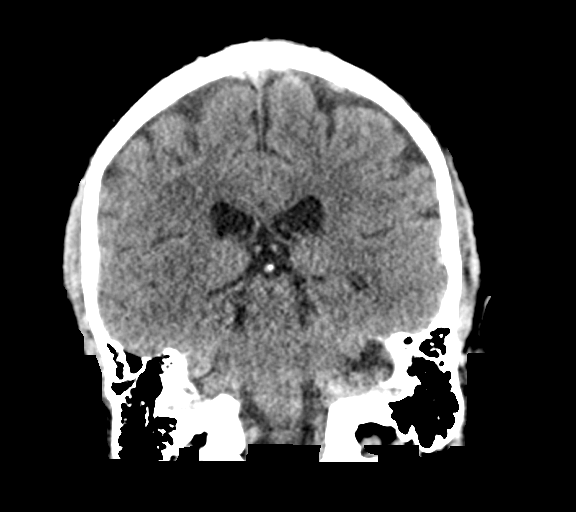
[im 42/75  brain]
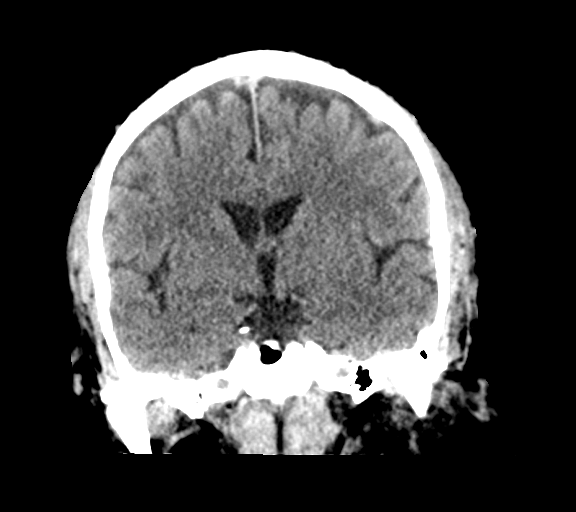

[Series 6: head without sag · sagittal · non-contrast · 0.46mm/px · 3 of 65 slices shown]
[im 22/65  brain]
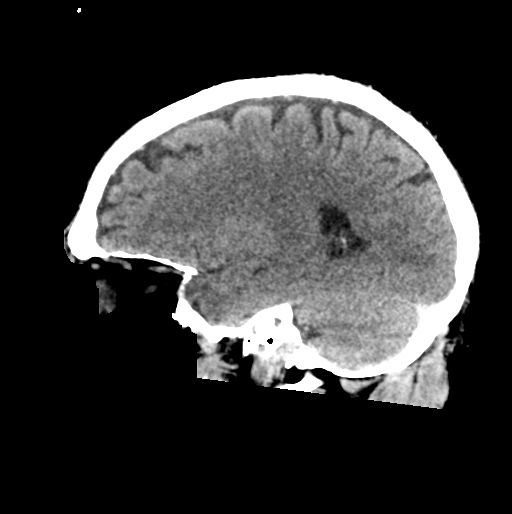
[im 33/65  brain]
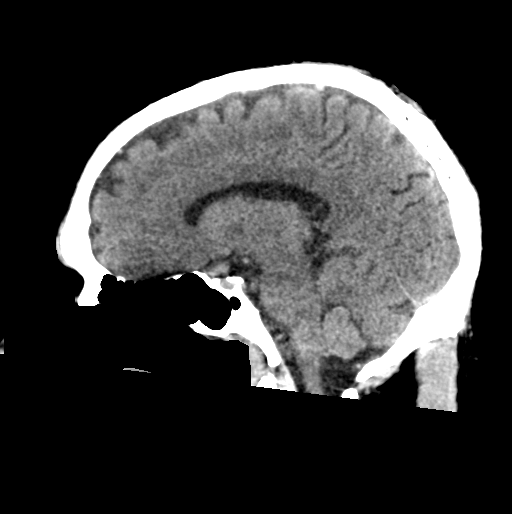
[im 43/65  brain]
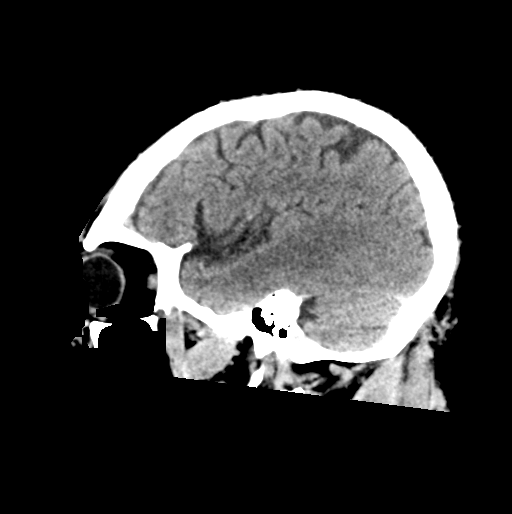

[15 of 47 positions shown; findings below may reference images not displayed]

FINDINGS: CT HEAD FINDINGS

Brain: Ventricles are normal in size and configuration. Thin acute
subdural hemorrhage along the anterior falx, measuring 2 mm greatest
thickness. No associated mass effect or midline shift.

No additional extra-axial hemorrhage. No parenchymal mass or
evidence of parenchymal edema.

Vascular: No hyperdense vessel or unexpected calcification.

Skull: Normal. Negative for fracture or focal lesion.

Sinuses/Orbits: No acute finding.

Other: None.

CT CERVICAL SPINE FINDINGS

Alignment: Straightening of the normal cervical spine lordosis,
likely related to underlying degenerative spondylosis and surgical
fusion. No evidence of acute vertebral body subluxation.

Skull base and vertebrae: Discontinuity of an osteophyte at the
anterior margin of the C5 vertebral body, most likely acute fracture
of the anterior osteophyte. No fracture extension into the C5
vertebral body or posterior elements.

Soft tissues and spinal canal: Questionable prevertebral fluid
anterior to the C5 and C6 vertebral bodies.

Disc levels: Degenerative spondylosis throughout the cervical spine,
mild to moderate in degree, most significant at the C4-5, C5-6 and
C7-T1 levels with associated disc space narrowings and osteophyte
formation and associated posterior disc-osteophytic bulges.
Associated moderate central canal stenosis at the C5-6 level.
Suspect at least moderate central canal stenosis at the [name] level,
but central canal is obscured by metallic artifact emanating from
adjacent disc space hardware at the [name] level.

Upper chest: Negative.

Other: None
IMPRESSION: 1. Thin acute subdural hemorrhage along the anterior falx, measuring
2 mm greatest thickness. No associated mass effect or midline shift.
Recommend short-term follow-up head CT to exclude enlargement.
2. No additional intracranial hemorrhage.  No skull fracture.
3. Discontinuity of an osteophyte at the anterior margin of the C5
vertebral body, most likely acute fracture of this anterior
osteophyte. No fracture extension into the C5 vertebral body.
4. Questionable prevertebral fluid anterior to the C5 and C6
vertebral bodies, likely related to the adjacent anterior osteophyte
fracture, raising the possibility of associated ligamentous injury
(anterior longitudinal ligament). Consider MRI for further
characterization.
5. Degenerative spondylosis of the cervical spine, most pronounced
within the mid and lower cervical spine as detailed above.
6. Disc space hardware in place at the [name] level. Suspect at least
moderate central canal stenosis at the [name] level, but central canal
is partially obscured by metallic artifact emanating from the
hardware.

Critical Value/emergent results were called by telephone at the time
of interpretation on 11/03/2020 at [DATE] to provider JANAYE PAUTA
, who verbally acknowledged these results.

## 2021-07-03 IMAGING — MR MR CERVICAL SPINE WO/W CM
4 of 8 series · 20 of 48 positions shown · IV contrast (gadavist)
Comparison: Cervical spine CT from earlier today

CLINICAL DATA: Neck trauma due to fall.

EXAM:
MRI CERVICAL SPINE WITHOUT AND WITH CONTRAST
TECHNIQUE: Multiplanar and multiecho pulse sequences of the cervical spine, to
include the craniocervical junction and cervicothoracic junction,
were obtained without and with intravenous contrast.
CONTRAST:  10mL GADAVIST GADOBUTROL 1 MMOL/ML IV SOLN

[Series 5: t2_tse_warp_sag · sagittal · 3.0mm · 0.43mm/px · 3 of 15 slices shown]
[im 1/15]
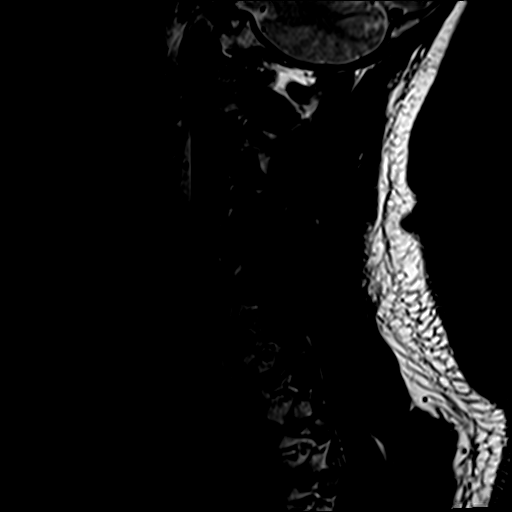
[im 10/15]
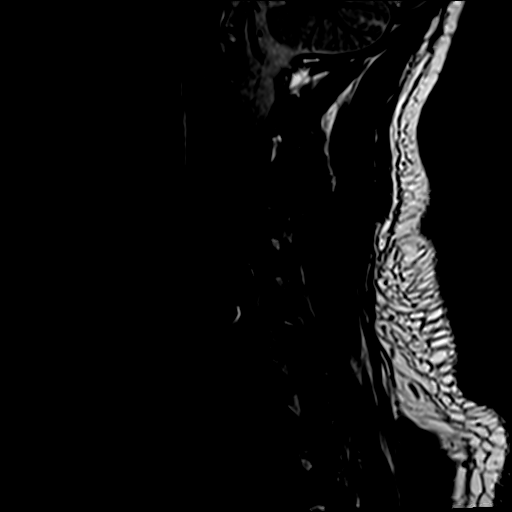
[im 15/15]
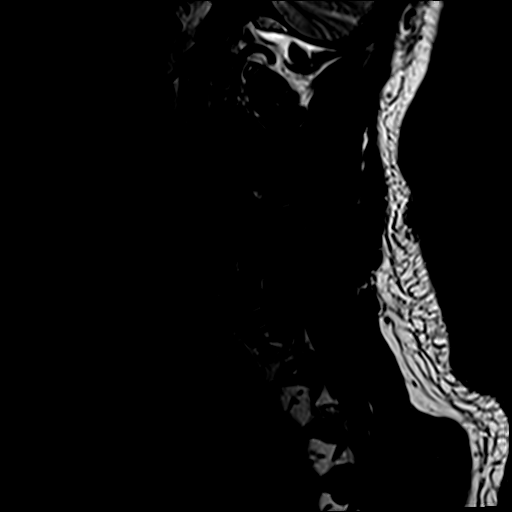

[Series 6: t1_tse_warp_sag · sagittal · 3.0mm · 0.43mm/px · 3 of 15 slices shown]
[im 1/15]
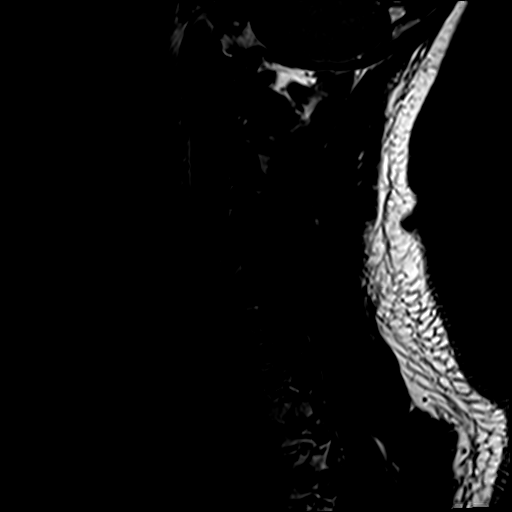
[im 10/15]
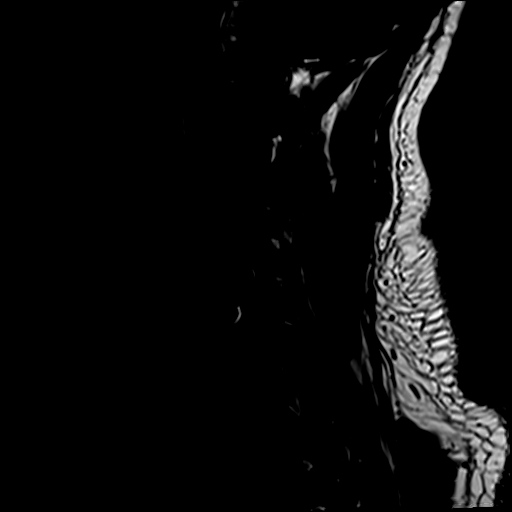
[im 15/15]
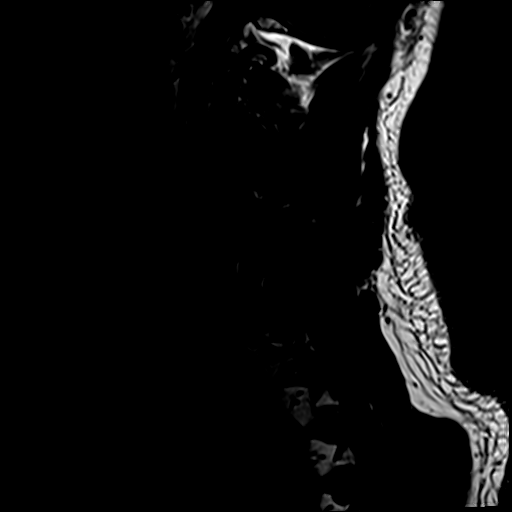

[Series 10: T1 · axial · 3.0mm · 0.39mm/px · z∈[-46,+59]mm · 8 of 36 slices shown (1 of 2)]
[im 1/36]
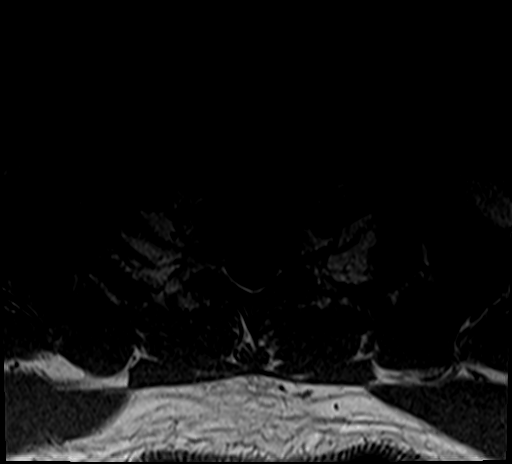
[im 6/36]
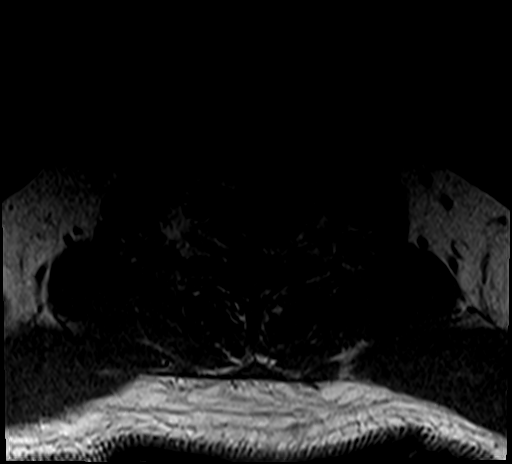
[im 11/36]
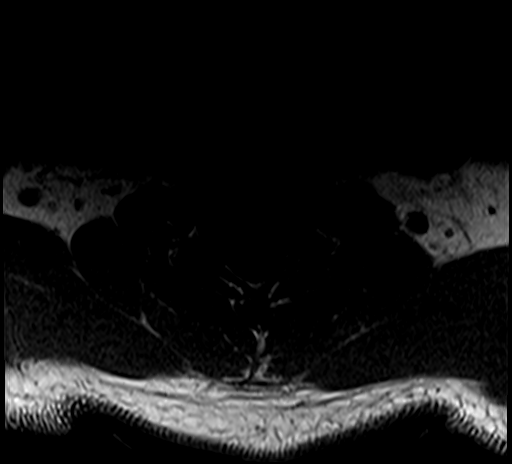
[im 16/36]
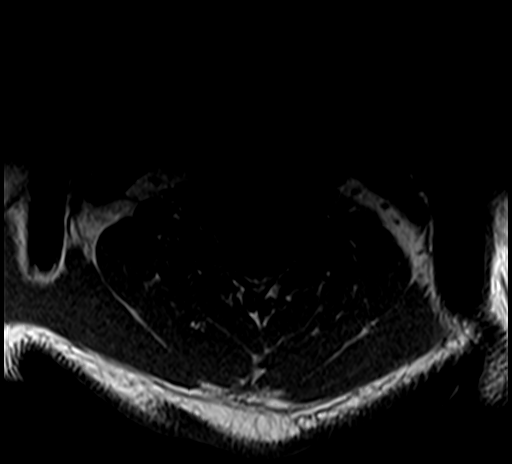
[im 21/36]
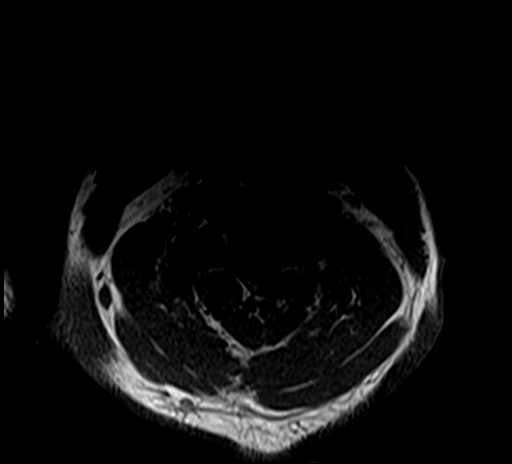
[im 26/36]
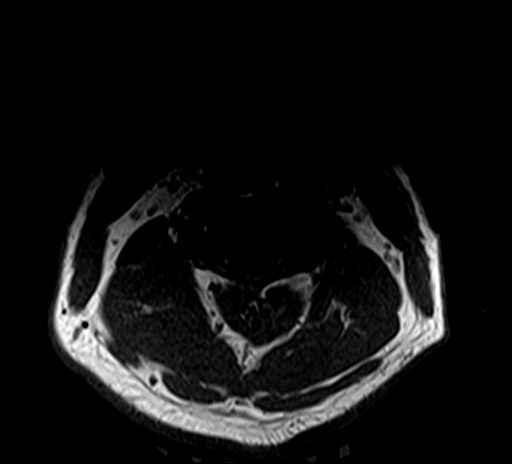
[im 31/36]
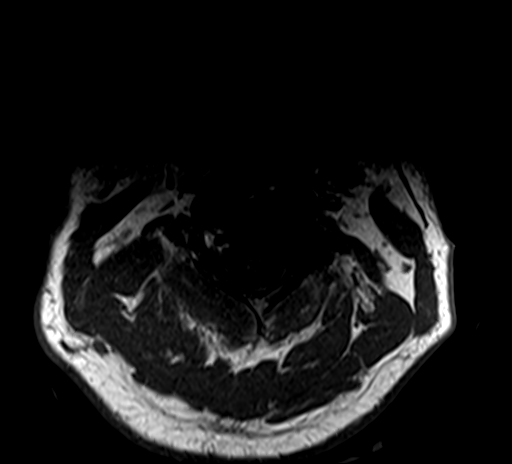
[im 36/36]
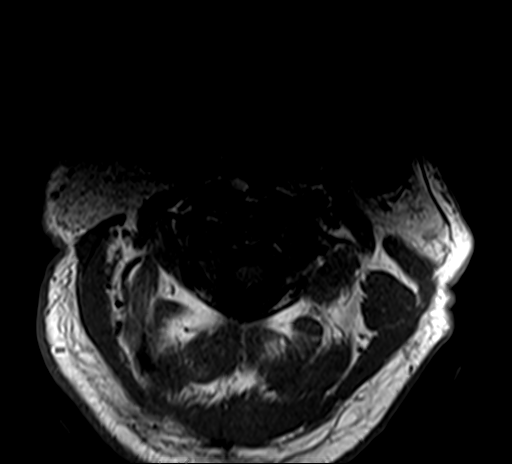

[Series 12: T1 · axial · 3.0mm · 0.39mm/px · z∈[-46,+44]mm · 6 of 36 slices shown (2 of 2)]
[im 1/36]
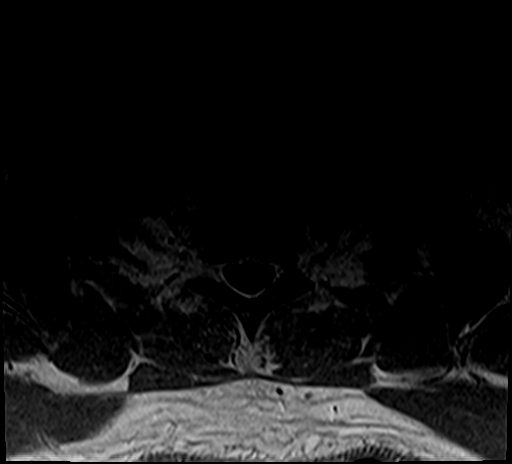
[im 6/36]
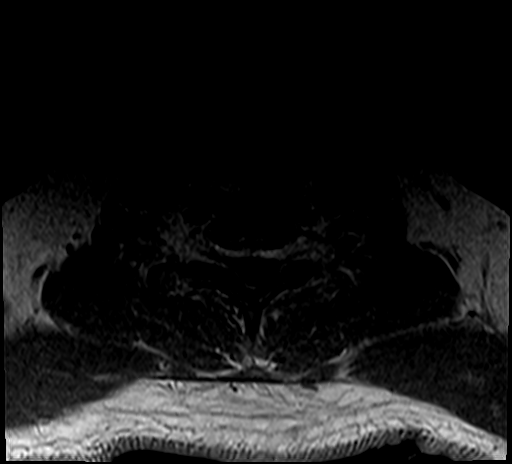
[im 11/36]
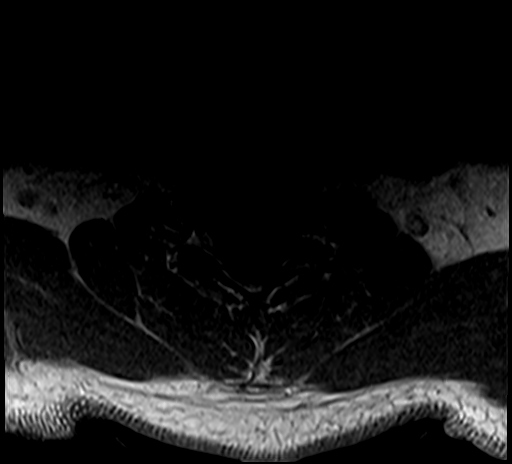
[im 16/36]
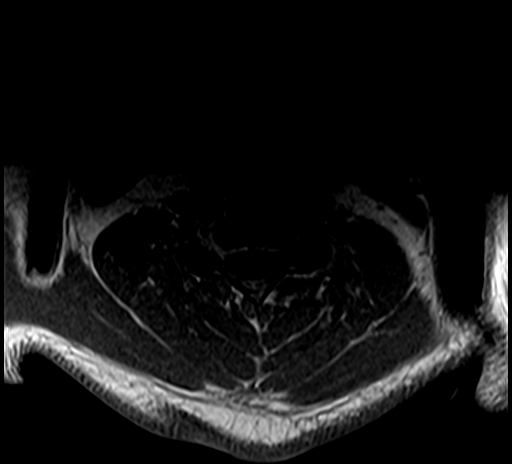
[im 21/36]
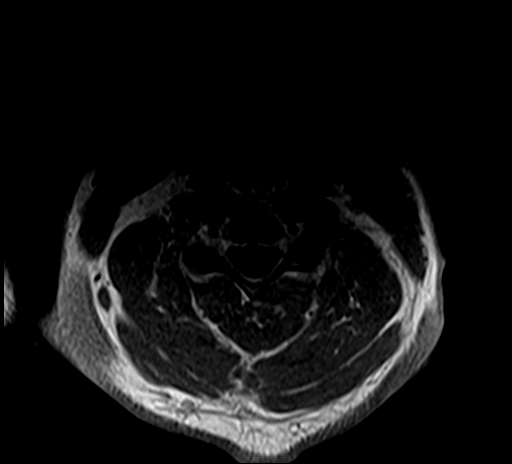
[im 31/36]
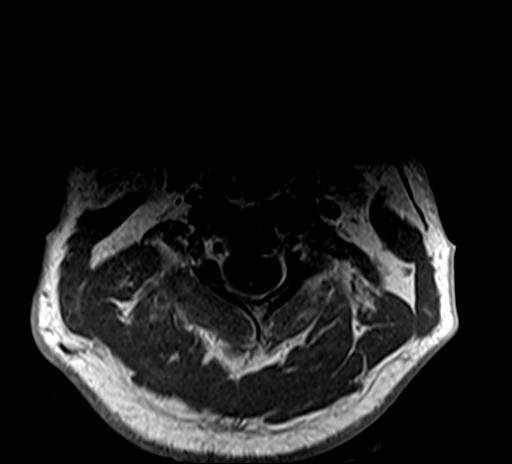

[20 of 48 positions shown; findings below may reference images not displayed]

FINDINGS: Alignment: No malalignment.

Vertebrae: Anterior osteophyte fractures are suspected at C5 on
prior CT. No detectable associated marrow edema but small fractures
such as this are often inapparent by MRI.

Disc arthroplasty at [name] which is located.

Left C2-3 facet joint effusion associated with advanced and erosive
facet arthropathy by CT.

Cord: Normal signal and morphology. No spinal canal collection is
seen.

Posterior Fossa, vertebral arteries, paraspinal tissues: Slight
prevertebral fluid at C4 and C5, supportive of the suspected
fractures at C5. No major ligamentous disruption is seen at this
level.

Inter spinous STIR hyperintensity to a mild degree at C7-T1, T1-2,
and T2-3, compatible with strain. No associated spinous process
fracture or ligamentum flavum disruption.

Disc levels:

C2-3: Facet osteoarthritis with bulky spurring asymmetric to the
left. Left uncovertebral ridging with moderate left foraminal
stenosis.

C3-4: Disc narrowing with asymmetric rightward uncovertebral and
facet spurring. Mild spinal stenosis. Mild left and moderate right
foraminal stenosis.

C4-5: Disc narrowing and ventral spondylitic spurring. Mild facet
spurring. No neural compression.

C5-6: Disc narrowing and bulging with uncovertebral ridging. Left
more than right foraminal impingement, high-grade appearing on the
left.

[name]: Disc arthroplasty with associated artifact. No bony
impingement by CT.

C7-T1:Unremarkable.
IMPRESSION: 1. Suspected C5 osteophyte fractures by CT with mild adjacent
prevertebral fluid. No evidence of major ligamentous disruption.
2. Interspinous strain at C7-T1 to T2-3 without subluxation.
3. Cervical spine degeneration with [name] disc arthroplasty, as
above.

## 2021-07-04 DIAGNOSIS — K5900 Constipation, unspecified: Secondary | ICD-10-CM | POA: Diagnosis not present

## 2021-07-04 DIAGNOSIS — R140 Abdominal distension (gaseous): Secondary | ICD-10-CM | POA: Diagnosis not present

## 2021-07-09 ENCOUNTER — Encounter: Payer: Self-pay | Admitting: Neurology

## 2021-07-09 ENCOUNTER — Other Ambulatory Visit: Payer: Self-pay | Admitting: Neurology

## 2021-07-09 MED ORDER — DIAZEPAM 10 MG PO TABS: ORAL_TABLET | ORAL | 5 refills | Status: DC

## 2021-07-28 DIAGNOSIS — Z9689 Presence of other specified functional implants: Secondary | ICD-10-CM | POA: Diagnosis not present

## 2021-07-28 DIAGNOSIS — Z5181 Encounter for therapeutic drug level monitoring: Secondary | ICD-10-CM | POA: Diagnosis not present

## 2021-07-28 DIAGNOSIS — G894 Chronic pain syndrome: Secondary | ICD-10-CM | POA: Diagnosis not present

## 2021-07-28 DIAGNOSIS — Z79899 Other long term (current) drug therapy: Secondary | ICD-10-CM | POA: Diagnosis not present

## 2021-07-28 DIAGNOSIS — M961 Postlaminectomy syndrome, not elsewhere classified: Secondary | ICD-10-CM | POA: Diagnosis not present

## 2021-07-31 DIAGNOSIS — H9201 Otalgia, right ear: Secondary | ICD-10-CM | POA: Diagnosis not present

## 2021-08-14 DIAGNOSIS — G43009 Migraine without aura, not intractable, without status migrainosus: Secondary | ICD-10-CM | POA: Diagnosis not present

## 2021-08-18 ENCOUNTER — Other Ambulatory Visit: Payer: Self-pay | Admitting: Neurology

## 2021-08-18 ENCOUNTER — Encounter: Payer: Self-pay | Admitting: Neurology

## 2021-08-18 MED ORDER — ESZOPICLONE 3 MG PO TABS: 3.0000 mg | ORAL_TABLET | Freq: Every evening | ORAL | 1 refills | Status: DC | PRN

## 2021-08-26 ENCOUNTER — Ambulatory Visit: Payer: BC Managed Care – PPO | Admitting: Neurology

## 2021-08-26 ENCOUNTER — Encounter: Payer: Self-pay | Admitting: Neurology

## 2021-08-26 ENCOUNTER — Other Ambulatory Visit: Payer: Self-pay

## 2021-08-26 VITALS — BP 158/87 | HR 74 | Ht 70.0 in | Wt 247.5 lb

## 2021-08-26 DIAGNOSIS — Z9689 Presence of other specified functional implants: Secondary | ICD-10-CM

## 2021-08-26 DIAGNOSIS — M5481 Occipital neuralgia: Secondary | ICD-10-CM | POA: Insufficient documentation

## 2021-08-26 DIAGNOSIS — G43711 Chronic migraine without aura, intractable, with status migrainosus: Secondary | ICD-10-CM | POA: Diagnosis not present

## 2021-08-26 DIAGNOSIS — M961 Postlaminectomy syndrome, not elsewhere classified: Secondary | ICD-10-CM | POA: Diagnosis not present

## 2021-08-26 MED ORDER — SUMATRIPTAN 20 MG/ACT NA SOLN: 20.0000 mg | NASAL | 2 refills | Status: DC | PRN

## 2021-08-26 NOTE — Patient Instructions (Signed)
Sumatriptan Nasal Spray What is this medication? SUMATRIPTAN (soo ma TRIP tan) treats migraines. It works by blocking pain signals and narrowing blood vessels in the brain. It belongs to a group of medications called triptans. It is not used to prevent migraines. This medicine may be used for other purposes; ask your health care provider or pharmacist if you have questions. COMMON BRAND NAME(S): Imitrex, Tosymra What should I tell my care team before I take this medication? They need to know if you have any of these conditions: Cigarette smoker Circulation problems in fingers and toes Heart disease High blood pressure High blood sugar (diabetes) High cholesterol History of irregular heartbeat History of stroke Kidney disease Liver disease Stomach or intestine problems An unusual or allergic reaction to sumatriptan, other medications, foods, dyes, or preservatives Pregnant or trying to get pregnant Breast-feeding How should I use this medication? This medication is for use in the nose. Follow the directions on your product or prescription label. Do not use more often than directed. Make sure that you are using your nasal spray correctly. Ask your care team if you have any questions. Talk to your care team regarding the use of this medication in children. Special care may be needed. Overdosage: If you think you have taken too much of this medicine contact a poison control center or emergency room at once. NOTE: This medicine is only for you. Do not share this medicine with others. What if I miss a dose? This does not apply. This medication is not for regular use. What may interact with this medication? Do not take this medication with any of the following: Certain medications for migraine headache like almotriptan, eletriptan, frovatriptan, naratriptan, rizatriptan, sumatriptan, zolmitriptan Ergot alkaloids like dihydroergotamine, ergonovine, ergotamine, methylergonovine MAOIs like  Carbex, Eldepryl, Marplan, Nardil, and Parnate This medication may also interact with the following: Certain medications for depression, anxiety, or psychotic disorders This list may not describe all possible interactions. Give your health care provider a list of all the medicines, herbs, non-prescription drugs, or dietary supplements you use. Also tell them if you smoke, drink alcohol, or use illegal drugs. Some items may interact with your medicine. What should I watch for while using this medication? Visit your care team for regular checks on your progress. Tell your care team if your symptoms do not start to get better or if they get worse. You may get drowsy or dizzy. Do not drive, use machinery, or do anything that needs mental alertness until you know how this medication affects you. Do not stand up or sit up quickly, especially if you are an older patient. This reduces the risk of dizzy or fainting spells. Alcohol may interfere with the effect of this medication. Tell your care team right away if you have any change in your eyesight. If you take migraine medications for 10 or more days a month, your migraines may get worse. Keep a diary of headache days and medication use. Contact your care team if your migraine attacks occur more frequently. What side effects may I notice from receiving this medication? Side effects that you should report to your care team as soon as possible: Allergic reactions--skin rash, itching, hives, swelling of the face, lips, tongue, or throat Burning, pain, tingling, or color changes in the legs or feet Heart attack--pain or tightness in the chest, shoulders, arms, or jaw, nausea, shortness of breath, cold or clammy skin, feeling faint or lightheaded Heart rhythm changes--fast or irregular heartbeat, dizziness, feeling faint or lightheaded,  chest pain, trouble breathing Increase in blood pressure Raynaud's--cool, numb, or painful fingers or toes that may change  color from pale, to blue, to red Seizures Serotonin syndrome--irritability, confusion, fast or irregular heartbeat, muscle stiffness, twitching muscles, sweating, high fever, seizure, chills, vomiting, diarrhea Stroke--sudden numbness or weakness of the face, arm, or leg, trouble speaking, confusion, trouble walking, loss of balance or coordination, dizziness, severe headache, change in vision Sudden or severe stomach pain, nausea, vomiting, fever, or bloody diarrhea Vision loss Side effects that usually do not require medical attention (report to your care team if they continue or are bothersome): Change in taste Dizziness General discomfort or fatigue Irritation inside the nose or throat This list may not describe all possible side effects. Call your doctor for medical advice about side effects. You may report side effects to FDA at 1-800-FDA-1088. Where should I keep my medication? Keep out of the reach of children and pets. Store at room temperature between 2 and 30 degrees C (36 and 86 degrees F). Throw away any unused medication after the expiration date. NOTE: This sheet is a summary. It may not cover all possible information. If you have questions about this medicine, talk to your doctor, pharmacist, or health care provider.  2022 [employer] (2020-10-18 00:00:00) Sumatriptan Injection What is this medication? SUMATRIPTAN (soo ma TRIP tan) treats migraines and cluster headaches. It works by blocking pain signals and narrowing blood vessels in the brain. It belongs to a group of medications called triptans. It is not used to prevent headaches or migraines. This medicine may be used for other purposes; ask your health care provider or pharmacist if you have questions. COMMON BRAND NAME(S): Alsuma, Imitrex, Imitrex STAT dose, Sumavel DosePro System, ZEMBRACE What should I tell my care team before I take this medication? They need to know if you have any of these  conditions: Cigarette smoker Circulation problems in fingers and toes Diabetes Heart disease High blood pressure High cholesterol History of irregular heartbeat History of stroke Kidney disease Liver disease Stomach or intestine problems An unusual or allergic reaction to sumatriptan, latex, other medications, foods, dyes, or preservatives Pregnant or trying to get pregnant Breast-feeding How should I use this medication? This medication is for injection under the skin. You will be taught how to prepare and give this medication. Use exactly as directed. Do not take your medication more often than directed. Talk to your care team regarding the use of this medication in children. Special care may be needed. Overdosage: If you think you have taken too much of this medicine contact a poison control center or emergency room at once. NOTE: This medicine is only for you. Do not share this medicine with others. What if I miss a dose? This does not apply. This medication is not for regular use. What may interact with this medication? Do not take this medication with any of the following: Certain medications for migraine headache like almotriptan, eletriptan, frovatriptan, naratriptan, rizatriptan, sumatriptan, zolmitriptan Ergot alkaloids like dihydroergotamine, ergonovine, ergotamine, methylergonovine MAOIs like Carbex, Eldepryl, Marplan, Nardil, and Parnate This medication may also interact with the following: Certain medications for depression, anxiety, or psychotic disorders This list may not describe all possible interactions. Give your health care provider a list of all the medicines, herbs, non-prescription drugs, or dietary supplements you use. Also tell them if you smoke, drink alcohol, or use illegal drugs. Some items may interact with your medicine. What should I watch for while using this medication? Visit your care  team for regular checks on your progress. Tell your care team if  your symptoms do not start to get better or if they get worse. You may get drowsy or dizzy. Do not drive, use machinery, or do anything that needs mental alertness until you know how this medication affects you. Do not stand up or sit up quickly, especially if you are an older patient. This reduces the risk of dizzy or fainting spells. Alcohol may interfere with the effect of this medication. Tell your care team right away if you have any change in your eyesight. If you take migraine medications for 10 or more days a month, your migraines may get worse. Keep a diary of headache days and medication use. Contact your care team if your migraine attacks occur more frequently. What side effects may I notice from receiving this medication? Side effects that you should report to your care team as soon as possible: Allergic reactions--skin rash, itching, hives, swelling of the face, lips, tongue, or throat Burning, pain, tingling, or color changes in the legs or feet Heart attack--pain or tightness in the chest, shoulders, arms, or jaw, nausea, shortness of breath, cold or clammy skin, feeling faint or lightheaded Heart rhythm changes--fast or irregular heartbeat, dizziness, feeling faint or lightheaded, chest pain, trouble breathing Increase in blood pressure Raynaud's--cool, numb, or painful fingers or toes that may change color from pale, to blue, to red Seizures Serotonin syndrome--irritability, confusion, fast or irregular heartbeat, muscle stiffness, twitching muscles, sweating, high fever, seizure, chills, vomiting, diarrhea Stroke--sudden numbness or weakness of the face, arm, or leg, trouble speaking, confusion, trouble walking, loss of balance or coordination, dizziness, severe headache, change in vision Sudden or severe stomach pain, nausea, vomiting, fever, or bloody diarrhea Vision loss Side effects that usually do not require medical attention (report to your care team if they continue or are  bothersome): Dizziness Facial flushing, redness General discomfort or fatigue This list may not describe all possible side effects. Call your doctor for medical advice about side effects. You may report side effects to FDA at 1-800-FDA-1088. Where should I keep my medication? Keep out of the reach of children and pets. You will be instructed on how to store this medication. Throw away any unused medication after the expiration date on the label. NOTE: This sheet is a summary. It may not cover all possible information. If you have questions about this medicine, talk to your doctor, pharmacist, or health care provider.  2022 [employer] (2020-10-18 00:00:00)
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
## 2021-09-18 ENCOUNTER — Encounter: Payer: Self-pay | Admitting: Neurology

## 2021-09-18 DIAGNOSIS — H9201 Otalgia, right ear: Secondary | ICD-10-CM | POA: Diagnosis not present

## 2021-09-22 NOTE — Telephone Encounter (Signed)
I hoe she will contact you earlier , you may want to keep your name up on the cancellation list by once a month calling her office/ Or by my chart message.   I am eager to help as well.  Melvyn Novas, MD

## 2021-10-02 DIAGNOSIS — M542 Cervicalgia: Secondary | ICD-10-CM | POA: Diagnosis not present

## 2021-10-14 ENCOUNTER — Encounter: Payer: Self-pay | Admitting: Neurology

## 2021-10-14 ENCOUNTER — Other Ambulatory Visit: Payer: Self-pay | Admitting: Neurology

## 2021-10-14 MED ORDER — ESZOPICLONE 3 MG PO TABS: 3.0000 mg | ORAL_TABLET | Freq: Every evening | ORAL | 5 refills | Status: DC | PRN

## 2021-10-15 DIAGNOSIS — R1030 Lower abdominal pain, unspecified: Secondary | ICD-10-CM | POA: Diagnosis not present

## 2021-10-15 DIAGNOSIS — R6881 Early satiety: Secondary | ICD-10-CM | POA: Diagnosis not present

## 2021-10-15 DIAGNOSIS — R634 Abnormal weight loss: Secondary | ICD-10-CM | POA: Diagnosis not present

## 2021-10-15 DIAGNOSIS — K5900 Constipation, unspecified: Secondary | ICD-10-CM | POA: Diagnosis not present

## 2021-11-04 DIAGNOSIS — M542 Cervicalgia: Secondary | ICD-10-CM | POA: Diagnosis not present

## 2021-11-05 ENCOUNTER — Other Ambulatory Visit (HOSPITAL_COMMUNITY): Payer: Self-pay | Admitting: Orthopedic Surgery

## 2021-11-05 DIAGNOSIS — M542 Cervicalgia: Secondary | ICD-10-CM

## 2021-11-07 ENCOUNTER — Encounter (HOSPITAL_COMMUNITY): Payer: Self-pay

## 2021-11-07 ENCOUNTER — Emergency Department (HOSPITAL_COMMUNITY): Payer: BC Managed Care – PPO

## 2021-11-07 ENCOUNTER — Other Ambulatory Visit: Payer: Self-pay

## 2021-11-07 ENCOUNTER — Emergency Department (HOSPITAL_COMMUNITY)
Admission: EM | Admit: 2021-11-07 | Discharge: 2021-11-07 | Disposition: A | Discharge status: Home / Self Care | Payer: BC Managed Care – PPO | Attending: Emergency Medicine | Admitting: Emergency Medicine

## 2021-11-07 DIAGNOSIS — I10 Essential (primary) hypertension: Secondary | ICD-10-CM | POA: Insufficient documentation

## 2021-11-07 DIAGNOSIS — Z79899 Other long term (current) drug therapy: Secondary | ICD-10-CM | POA: Insufficient documentation

## 2021-11-07 DIAGNOSIS — R1032 Left lower quadrant pain: Secondary | ICD-10-CM | POA: Diagnosis not present

## 2021-11-07 DIAGNOSIS — R109 Unspecified abdominal pain: Secondary | ICD-10-CM | POA: Diagnosis not present

## 2021-11-07 DIAGNOSIS — R1030 Lower abdominal pain, unspecified: Secondary | ICD-10-CM | POA: Diagnosis not present

## 2021-11-07 LAB — CBC WITH DIFFERENTIAL/PLATELET
Abs Immature Granulocytes: 0.06 10*3/uL (ref 0.00–0.07)
Basophils Absolute: 0.1 10*3/uL (ref 0.0–0.1)
Basophils Relative: 1 %
Eosinophils Absolute: 0.3 10*3/uL (ref 0.0–0.5)
Eosinophils Relative: 3 %
HCT: 58.4 % — ABNORMAL HIGH (ref 39.0–52.0)
Hemoglobin: 18.6 g/dL — ABNORMAL HIGH (ref 13.0–17.0)
Immature Granulocytes: 1 %
Lymphocytes Relative: 30 %
Lymphs Abs: 3.3 10*3/uL (ref 0.7–4.0)
MCH: 27.7 pg (ref 26.0–34.0)
MCHC: 31.8 g/dL (ref 30.0–36.0)
MCV: 87 fL (ref 80.0–100.0)
Monocytes Absolute: 1.1 10*3/uL — ABNORMAL HIGH (ref 0.1–1.0)
Monocytes Relative: 10 %
Neutro Abs: 6.3 10*3/uL (ref 1.7–7.7)
Neutrophils Relative %: 55 %
Platelets: 303 10*3/uL (ref 150–400)
RBC: 6.71 MIL/uL — ABNORMAL HIGH (ref 4.22–5.81)
RDW: 14.1 % (ref 11.5–15.5)
WBC: 11.1 10*3/uL — ABNORMAL HIGH (ref 4.0–10.5)
nRBC: 0 % (ref 0.0–0.2)

## 2021-11-07 LAB — LIPASE, BLOOD: Lipase: 47 U/L (ref 11–51)

## 2021-11-07 LAB — URINALYSIS, ROUTINE W REFLEX MICROSCOPIC
Bilirubin Urine: NEGATIVE
Glucose, UA: NEGATIVE mg/dL
Hgb urine dipstick: NEGATIVE
Ketones, ur: NEGATIVE mg/dL
Leukocytes,Ua: NEGATIVE
Nitrite: NEGATIVE
Protein, ur: NEGATIVE mg/dL
Specific Gravity, Urine: 1.01 (ref 1.005–1.030)
pH: 6 (ref 5.0–8.0)

## 2021-11-07 LAB — COMPREHENSIVE METABOLIC PANEL
ALT: 49 U/L — ABNORMAL HIGH (ref 0–44)
AST: 29 U/L (ref 15–41)
Albumin: 4.8 g/dL (ref 3.5–5.0)
Alkaline Phosphatase: 59 U/L (ref 38–126)
Anion gap: 8 (ref 5–15)
BUN: 20 mg/dL (ref 6–20)
CO2: 24 mmol/L (ref 22–32)
Calcium: 9.6 mg/dL (ref 8.9–10.3)
Chloride: 103 mmol/L (ref 98–111)
Creatinine, Ser: 1.47 mg/dL — ABNORMAL HIGH (ref 0.61–1.24)
GFR, Estimated: 57 mL/min — ABNORMAL LOW (ref >60)
Glucose, Bld: 81 mg/dL (ref 70–99)
Potassium: 3.5 mmol/L (ref 3.5–5.1)
Sodium: 135 mmol/L (ref 135–145)
Total Bilirubin: 0.6 mg/dL (ref 0.3–1.2)
Total Protein: 8.1 g/dL (ref 6.5–8.1)

## 2021-11-07 MED ORDER — CELECOXIB 200 MG PO CAPS: 200.0000 mg | ORAL_CAPSULE | Freq: Two times a day (BID) | ORAL | 0 refills | Status: DC

## 2021-11-07 MED ORDER — SODIUM CHLORIDE 0.9 % IV BOLUS
1000.0000 mL | Freq: Once | INTRAVENOUS | Status: AC
  Administered 2021-11-07: 1000 mL via INTRAVENOUS

## 2021-11-07 MED ORDER — MORPHINE SULFATE (PF) 4 MG/ML IV SOLN
4.0000 mg | Freq: Once | INTRAVENOUS | Status: AC
  Administered 2021-11-07: 4 mg via INTRAVENOUS
  Filled 2021-11-07: qty 1

## 2021-11-07 MED ORDER — KETOROLAC TROMETHAMINE 30 MG/ML IJ SOLN
30.0000 mg | Freq: Once | INTRAMUSCULAR | Status: AC
  Administered 2021-11-07: 30 mg via INTRAVENOUS
  Filled 2021-11-07: qty 1

## 2021-11-07 MED ORDER — IOHEXOL 300 MG/ML  SOLN
100.0000 mL | Freq: Once | INTRAMUSCULAR | Status: AC | PRN
  Administered 2021-11-07: 100 mL via INTRAVENOUS

## 2021-11-07 MED ORDER — ONDANSETRON HCL 4 MG/2ML IJ SOLN
4.0000 mg | Freq: Once | INTRAMUSCULAR | Status: AC
  Administered 2021-11-07: 4 mg via INTRAVENOUS
  Filled 2021-11-07: qty 2

## 2021-11-07 NOTE — ED Provider Triage Note (Signed)
Emergency Medicine Provider Triage Evaluation Note  [name] , a 51 y.o. male  was evaluated in triage.  Pt complains of lower abdominal pain for the last 7 months.  Patient states that he has been seen by GI specialist and provided medications for this issue however the pain continues.  Patient states that what prompted him to come in tonight was due to the pain.  Review of Systems  Positive: Lower abdominal pain, nausea, vomiting, decreased appetite Negative: Chest pain, shortness of breath  Physical Exam  BP (!) 153/106 (BP Location: Right Arm)    Pulse 77    Temp 98.2 F (36.8 C) (Oral)    Resp 18    Ht 5\' 10"  (1.778 m)    Wt 113.4 kg    SpO2 97%    BMI 35.87 kg/m  Gen:   Awake, no distress   Resp:  Normal effort  MSK:   Moves extremities without difficulty  Other:  Left lower quadrant and suprapubic abdominal pain  Medical Decision Making  Medically screening exam initiated at 8:35 PM.  Appropriate orders placed.  Gaynelle Arabian was informed that the remainder of the evaluation will be completed by another provider, this initial triage assessment does not replace that evaluation, and the importance of remaining in the ED until their evaluation is complete.     Azucena Cecil, PA-C 11/07/21 2036

## 2021-11-07 NOTE — ED Triage Notes (Signed)
Pt states that he has been having lower abdominal pain x 1 year. Pt states that this has been an ongoing issue even with him seeing a GI provider.

## 2021-11-07 NOTE — ED Provider Notes (Signed)
[place] DEPT Provider Note   CSN: [id number] Arrival date & time: 11/07/21  2005     History  Chief Complaint  Patient presents with   Abdominal Pain    [name] is a 51 y.o. male.  Pt is a 50 yo bm with a hx of chronic abd pain, high cholesterol, htn, and chronic back pain.  Pt has been going to GI for about a year to try to figure out what is causing his pain.  He has been put on several different GI meds and nothing has helped.  Pt said his lower abdominal pain worsened today.  He denies any fevers.  No n/v.      Home Medications Prior to Admission medications   Medication Sig Start Date End Date Taking? Authorizing Provider  celecoxib (CELEBREX) 200 MG capsule Take 1 capsule (200 mg total) by mouth 2 (two) times daily. 11/07/21  Yes Isla Pence, MD  amLODipine (NORVASC) 10 MG tablet Take 10 mg by mouth at bedtime.  12/20/12   [provider]  diazepam (VALIUM) 10 MG tablet Pt may take 5 mg in the morning and 10 mg in the evening (as needed) 07/09/21   Dohmeier, Asencion Partridge, MD  Eszopiclone 3 MG TABS Take 1 tablet (3 mg total) by mouth at bedtime as needed. Take immediately before bedtime 10/14/21   Dohmeier, Asencion Partridge, MD  hydrochlorothiazide (HYDRODIURIL) 25 MG tablet Take 25 mg by mouth daily.    [provider]  rosuvastatin (CRESTOR) 5 MG tablet Take 5 mg by mouth daily with supper. 01/27/17   [provider]  SUMAtriptan (IMITREX) 20 MG/ACT nasal spray Place 1 spray (20 mg total) into the nose every 2 (two) hours as needed for migraine or headache. May repeat in 2 hours if headache persists or recurs. 08/26/21   Dohmeier, Asencion Partridge, MD  testosterone cypionate (DEPOTESTOTERONE CYPIONATE) 100 MG/ML injection Inject 100 mg into the muscle every 7 (seven) days. Takes on Fridays. For IM use only.    [provider]  topiramate (TOPAMAX) 25 MG tablet Take 4 tablets by mouth at bedtime. 08/20/21   [provider]      Allergies    Losartan, Alprazolam, Hydrocodone-acetaminophen, Tiagabine, Covid-19 mrna vacc (moderna), and Percocet [oxycodone-acetaminophen]    Review of Systems   Review of Systems  Gastrointestinal:  Positive for abdominal pain.   Physical Exam Updated Vital Signs BP (!) 153/106 (BP Location: Right Arm)    Pulse 77    Temp 98.2 F (36.8 C) (Oral)    Resp 18    Ht 5\' 10"  (1.778 m)    Wt 113.4 kg    SpO2 97%    BMI 35.87 kg/m  Physical Exam Vitals and nursing note reviewed.  Constitutional:      Appearance: He is well-developed.  HENT:     Head: Normocephalic and atraumatic.     Mouth/Throat:     Mouth: Mucous membranes are moist.     Pharynx: Oropharynx is clear.  Eyes:     Extraocular Movements: Extraocular movements intact.     Pupils: Pupils are equal, round, and reactive to light.  Cardiovascular:     Rate and Rhythm: Normal rate and regular rhythm.     Heart sounds: Normal heart sounds.  Pulmonary:     Effort: Pulmonary effort is normal.     Breath sounds: Normal breath sounds.  Abdominal:     General: Abdomen is flat. Bowel sounds are normal.  Palpations: Abdomen is soft.     Tenderness: There is abdominal tenderness in the left lower quadrant.  Skin:    General: Skin is warm.     Capillary Refill: Capillary refill takes less than 2 seconds.  Neurological:     General: No focal deficit present.     Mental Status: He is alert and oriented to person, place, and time.  Psychiatric:        Mood and Affect: Mood normal.        Behavior: Behavior normal.    ED Results / Procedures / Treatments   Labs (all labs ordered are listed, but only abnormal results are displayed) Labs Reviewed  COMPREHENSIVE METABOLIC PANEL - Abnormal; Notable for the following components:      Result Value   Creatinine, Ser 1.47 (*)    ALT 49 (*)    GFR, Estimated 57 (*)    All other components within normal limits  CBC WITH DIFFERENTIAL/PLATELET - Abnormal; Notable for  the following components:   WBC 11.1 (*)    RBC 6.71 (*)    Hemoglobin 18.6 (*)    HCT 58.4 (*)    Monocytes Absolute 1.1 (*)    All other components within normal limits  URINALYSIS, ROUTINE W REFLEX MICROSCOPIC - Abnormal; Notable for the following components:   Color, Urine YELLOW (*)    APPearance CLEAR (*)    Bacteria, UA RARE (*)    All other components within normal limits  LIPASE, BLOOD  H PYLORI, IGM, IGG, IGA AB    EKG None  Radiology CT ABDOMEN PELVIS W CONTRAST  Result Date: 11/07/2021 CLINICAL DATA:  LLQ abdominal pain llq and suprapubic abdominal pain EXAM: CT ABDOMEN AND PELVIS WITH CONTRAST TECHNIQUE: Multidetector CT imaging of the abdomen and pelvis was performed using the standard protocol following bolus administration of intravenous contrast. RADIATION DOSE REDUCTION: This exam was performed according to the departmental dose-optimization program which includes automated exposure control, adjustment of the mA and/or kV according to patient size and/or use of iterative reconstruction technique. CONTRAST:  174mL OMNIPAQUE IOHEXOL 300 MG/ML  SOLN COMPARISON:  11/03/2020 FINDINGS: Lower chest: No acute abnormality Hepatobiliary: No focal hepatic abnormality. Gallbladder unremarkable. Pancreas: No focal abnormality or ductal dilatation. Spleen: No focal abnormality.  Normal size. Adrenals/Urinary Tract: No adrenal abnormality. No focal renal abnormality. No stones or hydronephrosis. Urinary bladder is unremarkable. Stomach/Bowel: Stomach, large and small bowel grossly unremarkable. Vascular/Lymphatic: No evidence of aneurysm or adenopathy. Reproductive: No visible focal abnormality. Other: No free fluid or free air. Musculoskeletal: No acute bony abnormality. IMPRESSION: No acute findings in the abdomen or pelvis. Electronically Signed   By: Rolm Baptise M.D.   On: 11/07/2021 22:15    Procedures Procedures    Medications Ordered in ED Medications  morphine (PF) 4 MG/ML  injection 4 mg (has no administration in time range)  ketorolac (TORADOL) 30 MG/ML injection 30 mg (has no administration in time range)  sodium chloride 0.9 % bolus 1,000 mL (0 mLs Intravenous Stopped 11/07/21 2319)  morphine (PF) 4 MG/ML injection 4 mg (4 mg Intravenous Given 11/07/21 2221)  ondansetron (ZOFRAN) injection 4 mg (4 mg Intravenous Given 11/07/21 2221)  iohexol (OMNIPAQUE) 300 MG/ML solution 100 mL (100 mLs Intravenous Contrast Given 11/07/21 2204)    ED Course/ Medical Decision Making/ A&P                           Medical Decision Making Amount  and/or Complexity of Data Reviewed Labs: ordered.  Risk Prescription drug management.   Pt has had a large work up by GI.  He had a normal colonoscopy 3 years ago.  He last had a CT of his abdomen 1 year ago.  I think it is reasonable to repeat this as pain has worsened.  Labs reviewed and are nl.  CT shows nothing acute.  Pt is frustrated because of his continued pain.  I do not have a causes for his pain today.  He is encouraged to f/u with GI.  Pt is already on Oxy IR 10s (90 filled on 1/11) and Diazepam 10s (45 filled on 1/27).   Abd pain may be due to his chronic narcotic use?  Pt will be d/c with a rx for celebrex as that will help pain without upsetting his stomach.  Return if worse.         Final Clinical Impression(s) / ED Diagnoses Final diagnoses:  Lower abdominal pain    Rx / DC Orders ED Discharge Orders          Ordered    celecoxib (CELEBREX) 200 MG capsule  2 times daily        11/07/21 2321              Isla Pence, MD 11/07/21 2323

## 2021-11-09 ENCOUNTER — Encounter (HOSPITAL_COMMUNITY): Payer: Self-pay | Admitting: Emergency Medicine

## 2021-11-09 ENCOUNTER — Other Ambulatory Visit: Payer: Self-pay

## 2021-11-09 ENCOUNTER — Emergency Department (HOSPITAL_COMMUNITY)
Admission: EM | Admit: 2021-11-09 | Discharge: 2021-11-09 | Disposition: A | Discharge status: Home / Self Care | Payer: BC Managed Care – PPO | Attending: Emergency Medicine | Admitting: Emergency Medicine

## 2021-11-09 DIAGNOSIS — Z79899 Other long term (current) drug therapy: Secondary | ICD-10-CM | POA: Insufficient documentation

## 2021-11-09 DIAGNOSIS — G8929 Other chronic pain: Secondary | ICD-10-CM | POA: Insufficient documentation

## 2021-11-09 DIAGNOSIS — R1032 Left lower quadrant pain: Secondary | ICD-10-CM | POA: Diagnosis not present

## 2021-11-09 DIAGNOSIS — R197 Diarrhea, unspecified: Secondary | ICD-10-CM | POA: Insufficient documentation

## 2021-11-09 DIAGNOSIS — R1030 Lower abdominal pain, unspecified: Secondary | ICD-10-CM

## 2021-11-09 LAB — COMPREHENSIVE METABOLIC PANEL
ALT: 30 U/L (ref 0–44)
AST: 44 U/L — ABNORMAL HIGH (ref 15–41)
Albumin: 3.9 g/dL (ref 3.5–5.0)
Alkaline Phosphatase: 54 U/L (ref 38–126)
Anion gap: 10 (ref 5–15)
BUN: 16 mg/dL (ref 6–20)
CO2: 22 mmol/L (ref 22–32)
Calcium: 8.8 mg/dL — ABNORMAL LOW (ref 8.9–10.3)
Chloride: 106 mmol/L (ref 98–111)
Creatinine, Ser: 1.56 mg/dL — ABNORMAL HIGH (ref 0.61–1.24)
GFR, Estimated: 53 mL/min — ABNORMAL LOW (ref >60)
Glucose, Bld: 87 mg/dL (ref 70–99)
Potassium: 4.8 mmol/L (ref 3.5–5.1)
Sodium: 138 mmol/L (ref 135–145)
Total Bilirubin: 1.7 mg/dL — ABNORMAL HIGH (ref 0.3–1.2)
Total Protein: 6.7 g/dL (ref 6.5–8.1)

## 2021-11-09 LAB — CBC WITH DIFFERENTIAL/PLATELET
Abs Immature Granulocytes: 0.03 10*3/uL (ref 0.00–0.07)
Basophils Absolute: 0.1 10*3/uL (ref 0.0–0.1)
Basophils Relative: 1 %
Eosinophils Absolute: 0.2 10*3/uL (ref 0.0–0.5)
Eosinophils Relative: 3 %
HCT: 54.8 % — ABNORMAL HIGH (ref 39.0–52.0)
Hemoglobin: 17.9 g/dL — ABNORMAL HIGH (ref 13.0–17.0)
Immature Granulocytes: 0 %
Lymphocytes Relative: 28 %
Lymphs Abs: 2.3 10*3/uL (ref 0.7–4.0)
MCH: 28.5 pg (ref 26.0–34.0)
MCHC: 32.7 g/dL (ref 30.0–36.0)
MCV: 87.1 fL (ref 80.0–100.0)
Monocytes Absolute: 0.9 10*3/uL (ref 0.1–1.0)
Monocytes Relative: 11 %
Neutro Abs: 4.8 10*3/uL (ref 1.7–7.7)
Neutrophils Relative %: 57 %
Platelets: 268 10*3/uL (ref 150–400)
RBC: 6.29 MIL/uL — ABNORMAL HIGH (ref 4.22–5.81)
RDW: 13.2 % (ref 11.5–15.5)
WBC: 8.3 10*3/uL (ref 4.0–10.5)
nRBC: 0 % (ref 0.0–0.2)

## 2021-11-09 LAB — LIPASE, BLOOD: Lipase: 50 U/L (ref 11–51)

## 2021-11-09 MED ORDER — SODIUM CHLORIDE 0.9 % IV BOLUS
500.0000 mL | Freq: Once | INTRAVENOUS | Status: AC
  Administered 2021-11-09: 500 mL via INTRAVENOUS

## 2021-11-09 MED ORDER — KETOROLAC TROMETHAMINE 15 MG/ML IJ SOLN
15.0000 mg | Freq: Once | INTRAMUSCULAR | Status: AC
Start: 2021-11-09 — End: 2021-11-09
  Administered 2021-11-09: 15 mg via INTRAVENOUS
  Filled 2021-11-09: qty 1

## 2021-11-09 MED ORDER — HYDROMORPHONE HCL 1 MG/ML IJ SOLN
1.0000 mg | Freq: Once | INTRAMUSCULAR | Status: AC
Start: 2021-11-09 — End: 2021-11-09
  Administered 2021-11-09: 1 mg via INTRAVENOUS
  Filled 2021-11-09: qty 1

## 2021-11-09 NOTE — ED Provider Notes (Signed)
[name] Provider Note   CSN: [id number] Arrival date & time: 11/09/21  1949     History  Chief Complaint  Patient presents with   Abdominal Pain    [name] is a 51 y.o. male with history of chronic abdominal pain.  Patient presents to ED for evaluation of abdominal pain.  For the last 7 months he has had abdominal pain in the left lower quadrant, suprapubically.  Patient was seen for this complaint 2 nights ago on 2/3 at [facility], had CT imaging performed with largely negative work-up.  Patient has had extensive GI work-up as an outpatient, unrevealing of any cause of this patient's chronic abdominal pain.  Patient returns to ED tonight complaining of abdominal pain that is now located diffusely across his lower abdomen.  He endorses abdominal pain, diarrhea.  He denies nausea, vomiting, blood in stool, constipation, chest pain, shortness of breath, fevers.  Patient is on extensive amount of pain medication, denies constipation however.  Abdominal Pain Associated symptoms: diarrhea   Associated symptoms: no chest pain, no chills, no constipation, no fever, no nausea, no shortness of breath and no vomiting       Home Medications Prior to Admission medications   Medication Sig Start Date End Date Taking? Authorizing Provider  amLODipine (NORVASC) 10 MG tablet Take 10 mg by mouth at bedtime.  12/20/12   [provider]  celecoxib (CELEBREX) 200 MG capsule Take 1 capsule (200 mg total) by mouth 2 (two) times daily. 11/07/21   Jacalyn Lefevre, MD  diazepam (VALIUM) 10 MG tablet Pt may take 5 mg in the morning and 10 mg in the evening (as needed) 07/09/21   Dohmeier, Porfirio Mylar, MD  Eszopiclone 3 MG TABS Take 1 tablet (3 mg total) by mouth at bedtime as needed. Take immediately before bedtime 10/14/21   Dohmeier, Porfirio Mylar, MD  hydrochlorothiazide (HYDRODIURIL) 25 MG tablet Take 25 mg by mouth daily.    [provider]   rosuvastatin (CRESTOR) 5 MG tablet Take 5 mg by mouth daily with supper. 01/27/17   [provider]  SUMAtriptan (IMITREX) 20 MG/ACT nasal spray Place 1 spray (20 mg total) into the nose every 2 (two) hours as needed for migraine or headache. May repeat in 2 hours if headache persists or recurs. 08/26/21   Dohmeier, Porfirio Mylar, MD  testosterone cypionate (DEPOTESTOTERONE CYPIONATE) 100 MG/ML injection Inject 100 mg into the muscle every 7 (seven) days. Takes on Fridays. For IM use only.    [provider]  topiramate (TOPAMAX) 25 MG tablet Take 4 tablets by mouth at bedtime. 08/20/21   [provider]      Allergies    Losartan, Alprazolam, Hydrocodone-acetaminophen, Tiagabine, Covid-19 mrna vacc (moderna), and Percocet [oxycodone-acetaminophen]    Review of Systems   Review of Systems  Constitutional:  Negative for chills and fever.  Respiratory:  Negative for shortness of breath.   Cardiovascular:  Negative for chest pain.  Gastrointestinal:  Positive for abdominal pain and diarrhea. Negative for blood in stool, constipation, nausea and vomiting.   Physical Exam Updated Vital Signs BP 140/87 (BP Location: Right Arm)    Pulse 72    Temp 98.1 F (36.7 C) (Oral)    Resp 15    SpO2 97%  Physical Exam Vitals and nursing note reviewed.  Constitutional:      General: He is not in acute distress.    Appearance: He is not ill-appearing, toxic-appearing or diaphoretic.  HENT:     Head: Normocephalic and atraumatic.     Mouth/Throat:     Mouth: Mucous membranes are moist.     Pharynx: Oropharynx is clear.  Eyes:     Extraocular Movements: Extraocular movements intact.     Pupils: Pupils are equal, round, and reactive to light.  Cardiovascular:     Rate and Rhythm: Normal rate and regular rhythm.  Pulmonary:     Effort: Pulmonary effort is normal.     Breath sounds: Normal breath sounds. No stridor. No wheezing, rhonchi or rales.  Abdominal:     General: Abdomen  is flat. Bowel sounds are normal. There is no distension.     Palpations: Abdomen is soft.     Tenderness: There is abdominal tenderness in the right lower quadrant, suprapubic area and left lower quadrant.  Musculoskeletal:        General: Normal range of motion.     Cervical back: Normal range of motion and neck supple. No rigidity or tenderness.  Skin:    General: Skin is warm and dry.     Capillary Refill: Capillary refill takes less than 2 seconds.  Neurological:     Mental Status: He is alert and oriented to person, place, and time.    ED Results / Procedures / Treatments   Labs (all labs ordered are listed, but only abnormal results are displayed) Labs Reviewed  CBC WITH DIFFERENTIAL/PLATELET - Abnormal; Notable for the following components:      Result Value   RBC 6.29 (*)    Hemoglobin 17.9 (*)    HCT 54.8 (*)    All other components within normal limits  COMPREHENSIVE METABOLIC PANEL - Abnormal; Notable for the following components:   Creatinine, Ser 1.56 (*)    Calcium 8.8 (*)    AST 44 (*)    Total Bilirubin 1.7 (*)    GFR, Estimated 53 (*)    All other components within normal limits  LIPASE, BLOOD    EKG None  Radiology No results found.  Procedures Procedures    Medications Ordered in ED Medications  sodium chloride 0.9 % bolus 500 mL (0 mLs Intravenous Stopped 11/09/21 2141)  ketorolac (TORADOL) 15 MG/ML injection 15 mg (15 mg Intravenous Given 11/09/21 2107)  HYDROmorphone (DILAUDID) injection 1 mg (1 mg Intravenous Given 11/09/21 2107)    ED Course/ Medical Decision Making/ A&P Clinical Course as of 11/09/21 2236  Sun Nov 09, 2021  2222 Sodium: 138 [CG]    Clinical Course User Index [CG] Al Decant, PA-C                           Medical Decision Making Amount and/or Complexity of Data Reviewed Labs: ordered.  Risk Prescription drug management.   51 year old male presents due to lower abdominal pain.  This patient has been seen  for this complaint numerous amount of times with negative work-ups.  The abdominal pain the patient is experiencing at night is similar to past instances he states.  The patient has been assessed by GI extensively to include imaging and colonoscopy with no cause identified.  Patient most recently seen for this complaint on 2/3 at [facility] and had a largely negative work-up here.  Patient had CT imaging done which did not show any cause for abdominal pain.  Due to recent CT imaging, I decided not to reimage the patient's abdomen this evening as his symptoms have remained the same.  Patient worked up utilizing CBC, CMP, lipase. CBC within normal limits CMP within normal limits, at patient baseline Lipase not elevated, 50  I offered this patient pain management, fluids.  He was given 1 mg Dilaudid, 15 mg Toradol, half liter of fluid.  Patient states after administration of Dilaudid he felt slightly better.  He stated that his pain went from a 7 to a 5.  At this time, I feel this patient is stable for discharge.  I have advised the patient that he should follow-up with his GI specialist.  He states that she has him scheduled for a "breath test" at [facility] for the next month.  This patient expressed frustration with his chronic abdominal pain and unrevealing lab results and test.  I advised the patient that he should continue trying to find the cause of his abdominal pain.  Patient case discussed with Dr. Rubin Payor who is in agreement with the plan for management.  The patient was given return precautions and he voiced understanding.  The patient had all of his questions answered to his satisfaction.  Patient is stable for discharge   Final Clinical Impression(s) / ED Diagnoses Final diagnoses:  Lower abdominal pain    Rx / DC Orders ED Discharge Orders     None         Clent Ridges 11/09/21 2236    Benjiman Core, MD 11/10/21 [phone]

## 2021-11-09 NOTE — Discharge Instructions (Signed)
Please return to ED with any new symptoms such as blood in stool, lightheadedness, dizziness Please follow-up with GI as we discussed. Please continue eating a diet that does not further aggravate your symptoms

## 2021-11-09 NOTE — ED Triage Notes (Signed)
Pt c/o lower abdominal pain x 66mos. Has seen GI for same, denies nausea/vomiting, reports pain has worsened today. Denies urinary complaints.

## 2021-11-12 DIAGNOSIS — R109 Unspecified abdominal pain: Secondary | ICD-10-CM | POA: Diagnosis not present

## 2021-11-12 DIAGNOSIS — R197 Diarrhea, unspecified: Secondary | ICD-10-CM | POA: Diagnosis not present

## 2021-11-24 ENCOUNTER — Other Ambulatory Visit: Payer: Self-pay

## 2021-11-24 ENCOUNTER — Ambulatory Visit (HOSPITAL_COMMUNITY)
Admission: RE | Admit: 2021-11-24 | Discharge: 2021-11-24 | Disposition: A | Discharge status: Home / Self Care | Payer: BC Managed Care – PPO | Source: Ambulatory Visit | Attending: Orthopedic Surgery | Admitting: Orthopedic Surgery

## 2021-11-24 DIAGNOSIS — K5900 Constipation, unspecified: Secondary | ICD-10-CM | POA: Diagnosis not present

## 2021-11-24 DIAGNOSIS — M542 Cervicalgia: Secondary | ICD-10-CM | POA: Insufficient documentation

## 2021-11-26 DIAGNOSIS — Z125 Encounter for screening for malignant neoplasm of prostate: Secondary | ICD-10-CM | POA: Diagnosis not present

## 2021-11-26 DIAGNOSIS — E291 Testicular hypofunction: Secondary | ICD-10-CM | POA: Diagnosis not present

## 2021-11-28 DIAGNOSIS — M47812 Spondylosis without myelopathy or radiculopathy, cervical region: Secondary | ICD-10-CM | POA: Diagnosis not present

## 2021-12-03 DIAGNOSIS — N5201 Erectile dysfunction due to arterial insufficiency: Secondary | ICD-10-CM | POA: Diagnosis not present

## 2021-12-03 DIAGNOSIS — E291 Testicular hypofunction: Secondary | ICD-10-CM | POA: Diagnosis not present

## 2021-12-03 DIAGNOSIS — D751 Secondary polycythemia: Secondary | ICD-10-CM | POA: Diagnosis not present

## 2021-12-15 ENCOUNTER — Ambulatory Visit: Payer: BC Managed Care – PPO | Admitting: Family Medicine

## 2021-12-23 ENCOUNTER — Other Ambulatory Visit: Payer: Self-pay | Admitting: Neurology

## 2021-12-24 ENCOUNTER — Other Ambulatory Visit: Payer: Self-pay | Admitting: Neurology

## 2021-12-24 ENCOUNTER — Encounter: Payer: Self-pay | Admitting: Neurology

## 2021-12-24 MED ORDER — DIAZEPAM 10 MG PO TABS: ORAL_TABLET | ORAL | 5 refills | Status: DC

## 2021-12-24 NOTE — Telephone Encounter (Signed)
Last OV was on 08/26/21.  ?Next OV is scheduled for 01/06/22 .  ?Last RX was written on 11/27/21 for 45 tabs.  ? ?[place] [facility] Database has been reviewed.  ?

## 2021-12-30 DIAGNOSIS — Z0000 Encounter for general adult medical examination without abnormal findings: Secondary | ICD-10-CM | POA: Diagnosis not present

## 2021-12-30 DIAGNOSIS — E782 Mixed hyperlipidemia: Secondary | ICD-10-CM | POA: Diagnosis not present

## 2021-12-31 DIAGNOSIS — M961 Postlaminectomy syndrome, not elsewhere classified: Secondary | ICD-10-CM | POA: Diagnosis not present

## 2021-12-31 DIAGNOSIS — M5030 Other cervical disc degeneration, unspecified cervical region: Secondary | ICD-10-CM | POA: Diagnosis not present

## 2021-12-31 DIAGNOSIS — M47812 Spondylosis without myelopathy or radiculopathy, cervical region: Secondary | ICD-10-CM | POA: Diagnosis not present

## 2022-01-05 ENCOUNTER — Other Ambulatory Visit: Payer: Self-pay | Admitting: *Deleted

## 2022-01-05 ENCOUNTER — Encounter: Payer: Self-pay | Admitting: Neurology

## 2022-01-05 MED ORDER — SUMATRIPTAN 20 MG/ACT NA SOLN: 20.0000 mg | NASAL | 2 refills | Status: DC | PRN

## 2022-01-06 ENCOUNTER — Encounter: Payer: Self-pay | Admitting: Neurology

## 2022-01-06 ENCOUNTER — Ambulatory Visit: Payer: BC Managed Care – PPO | Admitting: Neurology

## 2022-01-06 VITALS — BP 131/83 | HR 75 | Ht 70.0 in | Wt 221.5 lb

## 2022-01-06 DIAGNOSIS — M961 Postlaminectomy syndrome, not elsewhere classified: Secondary | ICD-10-CM

## 2022-01-06 DIAGNOSIS — M5481 Occipital neuralgia: Secondary | ICD-10-CM | POA: Diagnosis not present

## 2022-01-06 DIAGNOSIS — G4700 Insomnia, unspecified: Secondary | ICD-10-CM

## 2022-01-06 DIAGNOSIS — G478 Other sleep disorders: Secondary | ICD-10-CM

## 2022-01-06 DIAGNOSIS — F410 Panic disorder [episodic paroxysmal anxiety] without agoraphobia: Secondary | ICD-10-CM | POA: Diagnosis not present

## 2022-01-06 MED ORDER — DIAZEPAM 10 MG PO TABS: ORAL_TABLET | ORAL | 5 refills | Status: DC
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
## 2022-01-06 NOTE — Patient Instructions (Addendum)
Please remember to try to maintain good sleep hygiene, which means: Keep a regular sleep and wake schedule, try not to exercise or have a meal within 2 hours of your bedtime, try to keep your bedroom conducive for sleep, that is, cool and dark, without light distractors such as an illuminated alarm clock, and refrain from watching TV right before sleep or in the middle of the night and do not keep the TV or radio on during the night. Also, try not to use or play on electronic devices at bedtime, such as your cell phone, tablet PC or laptop. If you like to read at bedtime on an electronic device, try to dim the background light as much as possible. Do not eat in the middle of the night.  ? ?For chronic insomnia, you are best followed by a psychiatrist and/or sleep psychologist.  ? ?Continue to follow PAIN clinic. ? ?Please ask Dr Ethelene Hal to do a NCV and EMG study your cervical spine and arm.  ? ? ? ? ?

## 2022-01-07 ENCOUNTER — Encounter: Payer: Self-pay | Admitting: Neurology

## 2022-01-07 ENCOUNTER — Other Ambulatory Visit: Payer: Self-pay | Admitting: Neurology

## 2022-01-07 MED ORDER — DIAZEPAM 10 MG PO TABS: ORAL_TABLET | ORAL | 0 refills | Status: DC

## 2022-01-07 NOTE — Addendum Note (Signed)
Addended by: Larey Seat on: 01/07/2022 01:05 PM ? ? Modules accepted: Orders ? ?

## 2022-01-08 MED ORDER — DIAZEPAM 10 MG PO TABS: 5.0000 mg | ORAL_TABLET | Freq: Two times a day (BID) | ORAL | 5 refills | Status: DC | PRN

## 2022-01-12 DIAGNOSIS — K5903 Drug induced constipation: Secondary | ICD-10-CM | POA: Diagnosis not present

## 2022-01-20 ENCOUNTER — Encounter: Payer: Self-pay | Admitting: Neurology

## 2022-01-28 ENCOUNTER — Encounter: Payer: Self-pay | Admitting: Neurology

## 2022-02-11 ENCOUNTER — Ambulatory Visit: Payer: BC Managed Care – PPO | Admitting: Neurology

## 2022-02-19 DIAGNOSIS — E291 Testicular hypofunction: Secondary | ICD-10-CM | POA: Diagnosis not present

## 2022-02-23 ENCOUNTER — Ambulatory Visit: Payer: BC Managed Care – PPO | Admitting: Family Medicine

## 2022-02-23 DIAGNOSIS — F5104 Psychophysiologic insomnia: Secondary | ICD-10-CM | POA: Diagnosis not present

## 2022-02-23 DIAGNOSIS — G8929 Other chronic pain: Secondary | ICD-10-CM | POA: Diagnosis not present

## 2022-02-24 DIAGNOSIS — N5201 Erectile dysfunction due to arterial insufficiency: Secondary | ICD-10-CM | POA: Diagnosis not present

## 2022-02-24 DIAGNOSIS — E291 Testicular hypofunction: Secondary | ICD-10-CM | POA: Diagnosis not present

## 2022-02-24 DIAGNOSIS — R140 Abdominal distension (gaseous): Secondary | ICD-10-CM | POA: Diagnosis not present

## 2022-02-24 DIAGNOSIS — K5903 Drug induced constipation: Secondary | ICD-10-CM | POA: Diagnosis not present

## 2022-02-24 DIAGNOSIS — D751 Secondary polycythemia: Secondary | ICD-10-CM | POA: Diagnosis not present

## 2022-03-02 ENCOUNTER — Encounter: Payer: Self-pay | Admitting: Neurology

## 2022-03-04 DIAGNOSIS — G43011 Migraine without aura, intractable, with status migrainosus: Secondary | ICD-10-CM | POA: Diagnosis not present

## 2022-03-16 NOTE — Telephone Encounter (Signed)
Is [name], MD,  still practicing at [facility]?  That's my number one choice.

## 2022-03-18 ENCOUNTER — Encounter: Payer: Self-pay | Admitting: Neurology

## 2022-03-23 ENCOUNTER — Encounter: Payer: Self-pay | Admitting: Neurology

## 2022-03-23 MED ORDER — ESZOPICLONE 3 MG PO TABS: 3.0000 mg | ORAL_TABLET | Freq: Every evening | ORAL | 5 refills | Status: DC | PRN

## 2022-03-23 MED ORDER — SUMATRIPTAN 20 MG/ACT NA SOLN: 20.0000 mg | NASAL | 2 refills | Status: DC | PRN

## 2022-03-23 MED ORDER — UBRELVY 100 MG PO TABS: 100.0000 mg | ORAL_TABLET | ORAL | 5 refills | Status: DC | PRN

## 2022-03-23 MED ORDER — TOPIRAMATE 100 MG PO TABS: 100.0000 mg | ORAL_TABLET | Freq: Every evening | ORAL | 5 refills | Status: DC

## 2022-03-23 NOTE — Addendum Note (Signed)
Addended by: Judi Cong on: 03/23/2022 05:20 PM   Modules accepted: Orders

## 2022-03-23 NOTE — Addendum Note (Signed)
Addended by: Melvyn Novas on: 03/23/2022 05:39 PM   Modules accepted: Orders

## 2022-03-24 ENCOUNTER — Telehealth: Payer: Self-pay | Admitting: Neurology

## 2022-03-24 ENCOUNTER — Other Ambulatory Visit: Payer: Self-pay | Admitting: *Deleted

## 2022-03-24 NOTE — Telephone Encounter (Signed)
PA completed for the pt through [employer]MM[employer] [id number] Approved immediately Effective from 03/24/2022 through 06/15/2022.

## 2022-03-27 DIAGNOSIS — R140 Abdominal distension (gaseous): Secondary | ICD-10-CM | POA: Diagnosis not present

## 2022-03-27 DIAGNOSIS — K5900 Constipation, unspecified: Secondary | ICD-10-CM | POA: Diagnosis not present

## 2022-03-27 DIAGNOSIS — R634 Abnormal weight loss: Secondary | ICD-10-CM | POA: Diagnosis not present

## 2022-03-27 DIAGNOSIS — R1084 Generalized abdominal pain: Secondary | ICD-10-CM | POA: Diagnosis not present

## 2022-03-27 DIAGNOSIS — R197 Diarrhea, unspecified: Secondary | ICD-10-CM | POA: Diagnosis not present

## 2022-03-27 DIAGNOSIS — R109 Unspecified abdominal pain: Secondary | ICD-10-CM | POA: Diagnosis not present

## 2022-04-01 DIAGNOSIS — Z79899 Other long term (current) drug therapy: Secondary | ICD-10-CM | POA: Diagnosis not present

## 2022-04-01 DIAGNOSIS — M47812 Spondylosis without myelopathy or radiculopathy, cervical region: Secondary | ICD-10-CM | POA: Diagnosis not present

## 2022-04-01 DIAGNOSIS — Z5181 Encounter for therapeutic drug level monitoring: Secondary | ICD-10-CM | POA: Diagnosis not present

## 2022-04-01 DIAGNOSIS — M5030 Other cervical disc degeneration, unspecified cervical region: Secondary | ICD-10-CM | POA: Diagnosis not present

## 2022-04-01 DIAGNOSIS — M5416 Radiculopathy, lumbar region: Secondary | ICD-10-CM | POA: Diagnosis not present

## 2022-04-01 DIAGNOSIS — M961 Postlaminectomy syndrome, not elsewhere classified: Secondary | ICD-10-CM | POA: Diagnosis not present

## 2022-04-22 ENCOUNTER — Encounter: Payer: Self-pay | Admitting: Neurology

## 2022-05-07 DIAGNOSIS — K5900 Constipation, unspecified: Secondary | ICD-10-CM | POA: Diagnosis not present

## 2022-05-07 DIAGNOSIS — R109 Unspecified abdominal pain: Secondary | ICD-10-CM | POA: Diagnosis not present

## 2022-05-19 DIAGNOSIS — F5101 Primary insomnia: Secondary | ICD-10-CM | POA: Diagnosis not present

## 2022-05-31 ENCOUNTER — Encounter (HOSPITAL_COMMUNITY): Payer: Self-pay

## 2022-05-31 ENCOUNTER — Emergency Department (HOSPITAL_COMMUNITY)
Admission: EM | Admit: 2022-05-31 | Discharge: 2022-05-31 | Disposition: A | Discharge status: Home / Self Care | Payer: BC Managed Care – PPO | Attending: Emergency Medicine | Admitting: Emergency Medicine

## 2022-05-31 ENCOUNTER — Emergency Department (HOSPITAL_COMMUNITY): Payer: BC Managed Care – PPO

## 2022-05-31 DIAGNOSIS — R1084 Generalized abdominal pain: Secondary | ICD-10-CM | POA: Diagnosis not present

## 2022-05-31 DIAGNOSIS — R824 Acetonuria: Secondary | ICD-10-CM | POA: Diagnosis not present

## 2022-05-31 DIAGNOSIS — R109 Unspecified abdominal pain: Secondary | ICD-10-CM | POA: Diagnosis not present

## 2022-05-31 LAB — COMPREHENSIVE METABOLIC PANEL
ALT: 36 U/L (ref 0–44)
AST: 31 U/L (ref 15–41)
Albumin: 4.9 g/dL (ref 3.5–5.0)
Alkaline Phosphatase: 63 U/L (ref 38–126)
Anion gap: 9 (ref 5–15)
BUN: 13 mg/dL (ref 6–20)
CO2: 23 mmol/L (ref 22–32)
Calcium: 9.3 mg/dL (ref 8.9–10.3)
Chloride: 105 mmol/L (ref 98–111)
Creatinine, Ser: 1.56 mg/dL — ABNORMAL HIGH (ref 0.61–1.24)
GFR, Estimated: 53 mL/min — ABNORMAL LOW (ref >60)
Glucose, Bld: 77 mg/dL (ref 70–99)
Potassium: 4.1 mmol/L (ref 3.5–5.1)
Sodium: 137 mmol/L (ref 135–145)
Total Bilirubin: 0.9 mg/dL (ref 0.3–1.2)
Total Protein: 8.4 g/dL — ABNORMAL HIGH (ref 6.5–8.1)

## 2022-05-31 LAB — CBC WITH DIFFERENTIAL/PLATELET
Abs Immature Granulocytes: 0.04 10*3/uL (ref 0.00–0.07)
Basophils Absolute: 0.1 10*3/uL (ref 0.0–0.1)
Basophils Relative: 1 %
Eosinophils Absolute: 0.2 10*3/uL (ref 0.0–0.5)
Eosinophils Relative: 2 %
HCT: 59.1 % — ABNORMAL HIGH (ref 39.0–52.0)
Hemoglobin: 18.8 g/dL — ABNORMAL HIGH (ref 13.0–17.0)
Immature Granulocytes: 0 %
Lymphocytes Relative: 25 %
Lymphs Abs: 2.8 10*3/uL (ref 0.7–4.0)
MCH: 27.9 pg (ref 26.0–34.0)
MCHC: 31.8 g/dL (ref 30.0–36.0)
MCV: 87.7 fL (ref 80.0–100.0)
Monocytes Absolute: 1 10*3/uL (ref 0.1–1.0)
Monocytes Relative: 9 %
Neutro Abs: 7.2 10*3/uL (ref 1.7–7.7)
Neutrophils Relative %: 63 %
Platelets: 279 10*3/uL (ref 150–400)
RBC: 6.74 MIL/uL — ABNORMAL HIGH (ref 4.22–5.81)
RDW: 13.3 % (ref 11.5–15.5)
WBC: 11.3 10*3/uL — ABNORMAL HIGH (ref 4.0–10.5)
nRBC: 0 % (ref 0.0–0.2)

## 2022-05-31 LAB — URINALYSIS, ROUTINE W REFLEX MICROSCOPIC
Bilirubin Urine: NEGATIVE
Glucose, UA: NEGATIVE mg/dL
Hgb urine dipstick: NEGATIVE
Ketones, ur: 20 mg/dL — AB
Leukocytes,Ua: NEGATIVE
Nitrite: NEGATIVE
Protein, ur: NEGATIVE mg/dL
Specific Gravity, Urine: 1.012 (ref 1.005–1.030)
pH: 5 (ref 5.0–8.0)

## 2022-05-31 LAB — LIPASE, BLOOD: Lipase: 70 U/L — ABNORMAL HIGH (ref 11–51)

## 2022-05-31 MED ORDER — MORPHINE SULFATE (PF) 4 MG/ML IV SOLN
4.0000 mg | Freq: Once | INTRAVENOUS | Status: AC
  Administered 2022-05-31: 4 mg via INTRAVENOUS
  Filled 2022-05-31: qty 1

## 2022-05-31 MED ORDER — SODIUM CHLORIDE 0.9 % IV BOLUS
1000.0000 mL | Freq: Once | INTRAVENOUS | Status: AC
  Administered 2022-05-31: 1000 mL via INTRAVENOUS

## 2022-05-31 MED ORDER — IOHEXOL 300 MG/ML  SOLN
100.0000 mL | Freq: Once | INTRAMUSCULAR | Status: AC | PRN
  Administered 2022-05-31: 100 mL via INTRAVENOUS

## 2022-05-31 NOTE — ED Provider Notes (Signed)
[facility] DEPT Provider Note   CSN: [id number] Arrival date & time: 05/31/22  1305     History  Chief Complaint  Patient presents with   Abdominal Pain    [name] is a 51 y.o. male.  Patient with history of chronic abdominal pain, hyperlipidemia, and chronic back pain presents today with complaints of abdominal pain. Patient states that same began over a year ago and has been intermittent in nature since then. Patient has been seen by GI for the past year and a half with work-up that has been unremarkable for an explanation for his symptoms. He has also been to the ER twice in February 2023 with CT imaging that was unremarkable.  He did states that his pain was pretty persistent until 2 weeks ago where it spontaneously went away completely.  He states that it remained completely resolved for 1 week and then suddenly came back again 1 week ago.  He states that his pain is generalized throughout his abdomen and is worse than it was before.  He has been unable to schedule an appointment for follow-up with his GI doctor and therefore presents for same.  He denies any nausea, vomiting, or diarrhea.  Does endorse associated early satiety.  Also states that he has lost about 30 pounds unintentionally in the last few months. He denies hematochezia, melena, chest pain, shortness of breath, fevers, chills, night sweats. States that he had a colonoscopy several years ago that was unremarkable and was told that he did not need another one for 10 years and it has not been 10 years yet. nausea, vomiting, blood in stool, constipation, chest pain, shortness of breath, fevers.  Patient is on chronic pain medication for his back pain, denies constipation.   Abdominal Pain      Home Medications Prior to Admission medications   Medication Sig Start Date End Date Taking? Authorizing Provider  amLODipine (NORVASC) 10 MG tablet Take 10 mg by mouth at bedtime.  12/20/12    [provider]  diazepam (VALIUM) 10 MG tablet Take 0.5-1.5 tablets (5-15 mg total) by mouth 2 (two) times daily as needed for anxiety (Takes 5 mg in am and 15 mg at bedtime). Pt may take 5 mg in the morning and 10 mg in the evening (as needed) 01/08/22   Dohmeier, Asencion Partridge, MD  Eszopiclone 3 MG TABS Take 1 tablet (3 mg total) by mouth at bedtime as needed. Take immediately before bedtime 03/23/22   Dohmeier, Asencion Partridge, MD  hydrochlorothiazide (HYDRODIURIL) 25 MG tablet Take 25 mg by mouth daily.    [provider]  morphine (MSIR) 15 MG tablet 1 tablet as needed 12/30/21   [provider]  rosuvastatin (CRESTOR) 5 MG tablet Take 5 mg by mouth daily with supper. 01/27/17   [provider]  testosterone cypionate (DEPOTESTOTERONE CYPIONATE) 100 MG/ML injection Inject 100 mg into the muscle every 7 (seven) days. Takes on Fridays. For IM use only.    [provider]  topiramate (TOPAMAX) 100 MG tablet Take 1 tablet (100 mg total) by mouth at bedtime. 03/23/22   Dohmeier, Asencion Partridge, MD  Ubrogepant (UBRELVY) 100 MG TABS Take 100 mg by mouth as needed (take 1 tablet at onset of migraine. May repeat in 2 hours if needed. (Do not exceed more than 2 tab in 24 hrs)). 03/23/22   Dohmeier, Asencion Partridge, MD      Allergies    Losartan, Alprazolam, Hydrocodone-acetaminophen, Tiagabine, Covid-19 mrna vacc (moderna), and Percocet [  oxycodone-acetaminophen]    Review of Systems   Review of Systems  Gastrointestinal:  Positive for abdominal pain.  All other systems reviewed and are negative.   Physical Exam Updated Vital Signs BP (!) 157/98   Pulse 71   Temp 98.2 F (36.8 C)   Resp 16   SpO2 95%  Physical Exam Vitals and nursing note reviewed.  Constitutional:      General: He is not in acute distress.    Appearance: Normal appearance. He is well-developed and normal weight. He is not ill-appearing, toxic-appearing or diaphoretic.  HENT:     Head: Normocephalic and atraumatic.   Cardiovascular:     Rate and Rhythm: Normal rate and regular rhythm.     Heart sounds: Normal heart sounds.  Pulmonary:     Effort: Pulmonary effort is normal. No respiratory distress.     Breath sounds: Normal breath sounds.  Abdominal:     General: Abdomen is flat.     Palpations: Abdomen is soft.     Tenderness: There is generalized abdominal tenderness. There is no guarding or rebound.  Musculoskeletal:        General: Normal range of motion.     Cervical back: Normal range of motion.  Skin:    General: Skin is warm and dry.  Neurological:     General: No focal deficit present.     Mental Status: He is alert.  Psychiatric:        Mood and Affect: Mood normal.        Behavior: Behavior normal.     ED Results / Procedures / Treatments   Labs (all labs ordered are listed, but only abnormal results are displayed) Labs Reviewed  LIPASE, BLOOD - Abnormal; Notable for the following components:      Result Value   Lipase 70 (*)    All other components within normal limits  CBC WITH DIFFERENTIAL/PLATELET - Abnormal; Notable for the following components:   WBC 11.3 (*)    RBC 6.74 (*)    Hemoglobin 18.8 (*)    HCT 59.1 (*)    All other components within normal limits  COMPREHENSIVE METABOLIC PANEL - Abnormal; Notable for the following components:   Creatinine, Ser 1.56 (*)    Total Protein 8.4 (*)    GFR, Estimated 53 (*)    All other components within normal limits  URINALYSIS, ROUTINE W REFLEX MICROSCOPIC - Abnormal; Notable for the following components:   Ketones, ur 20 (*)    All other components within normal limits    EKG None  Radiology CT ABDOMEN PELVIS W CONTRAST  Result Date: 05/31/2022 CLINICAL DATA:  Abdominal pain. EXAM: CT ABDOMEN AND PELVIS WITH CONTRAST TECHNIQUE: Multidetector CT imaging of the abdomen and pelvis was performed using the standard protocol following bolus administration of intravenous contrast. RADIATION DOSE REDUCTION: This exam was  performed according to the departmental dose-optimization program which includes automated exposure control, adjustment of the mA and/or kV according to patient size and/or use of iterative reconstruction technique. CONTRAST:  OMNIPAQUE IOHEXOL 300 MG/ML  SOLN COMPARISON:  CT abdomen and pelvis 11/07/2021 FINDINGS: Lower chest: No acute abnormality. Hepatobiliary: No focal liver abnormality is seen. No gallstones, gallbladder wall thickening, or biliary dilatation. Pancreas: Unremarkable. No pancreatic ductal dilatation or surrounding inflammatory changes. Spleen: Normal in size without focal abnormality. Adrenals/Urinary Tract: Adrenal glands are unremarkable. Kidneys are normal, without renal calculi, focal lesion, or hydronephrosis. Bladder is unremarkable. Stomach/Bowel: Stomach is within normal limits. Appendix appears  normal. No evidence of bowel wall thickening, distention, or inflammatory changes. Vascular/Lymphatic: No significant vascular findings are present. No enlarged abdominal or pelvic lymph nodes. Reproductive: Prostate is unremarkable. Other: No abdominal wall hernia or abnormality. No abdominopelvic ascites. Musculoskeletal: Generator and wires are seen in the soft tissues of the posterior right back. No acute fracture or malalignment. There are degenerative changes at L5-S1. IMPRESSION: 1. No acute localizing process in the abdomen or pelvis. Electronically Signed   By: Ronney Asters M.D.   On: 05/31/2022 21:50    Procedures Procedures    Medications Ordered in ED Medications  sodium chloride 0.9 % bolus 1,000 mL (1,000 mLs Intravenous New Bag/Given 05/31/22 2047)  morphine (PF) 4 MG/ML injection 4 mg (4 mg Intravenous Given 05/31/22 2047)  iohexol (OMNIPAQUE) 300 MG/ML solution 100 mL (100 mLs Intravenous Contrast Given 05/31/22 2112)    ED Course/ Medical Decision Making/ A&P                           Medical Decision Making Amount and/or Complexity of Data  Reviewed Radiology: ordered.  Risk Prescription drug management.   This patient presents to the ED for concern of abdominal pain, this involves an extensive number of treatment options, and is a complaint that carries with it a high risk of complications and morbidity.    Co morbidities that complicate the patient evaluation  Hx chronic abdominal pain   Additional history obtained:  Additional history obtained from epic chart review   Lab Tests:  I Ordered, and personally interpreted labs.  The pertinent results include:  Lipase 70, WBC 11.3, creatinine 1.56 consistent with previous. Ketonuria   Imaging Studies ordered:  I ordered imaging studies including CT abdomen pelvis  I independently visualized and interpreted imaging which showed no acute findings I agree with the radiologist interpretation   Problem List / ED Course / Critical interventions / Medication management  Chronic abdominal pain I ordered medication including morphine and fluids  for pain and dehydration  Reevaluation of the patient after these medicines showed that the patient improved I have reviewed the patients home medicines and have made adjustments as needed   Test / Admission - Considered:  Patient presents today with complaints of abdominal pain. He is nontoxic, nonseptic appearing, in no apparent distress.  Patient's pain and other symptoms adequately managed in emergency department.  Fluid bolus given.  Labs, imaging and vitals reviewed.  Patient does not meet the SIRS or Sepsis criteria.  On repeat exam patient does not have a surgical abdomin and there are no peritoneal signs.  No indication of appendicitis, bowel obstruction, bowel perforation, cholecystitis, diverticulitis. Patient with unclear etiology for symptoms. However, given his unintentional weight loss and early satiety, do suspect that patient needs further evaluation outpatient. Patient endorses frustration with his GI team through  [facility], will give referral to [facility] per his request. Patient is stable for discharge at this time. Given strict instructions for follow-up with their primary care physician.  I have also discussed reasons to return immediately to the ER.  Patient expresses understanding and agrees with plan. Discharged in stable condition.   Final Clinical Impression(s) / ED Diagnoses Final diagnoses:  Generalized abdominal pain    Rx / DC Orders ED Discharge Orders     None     An After Visit Summary was printed and given to the patient.     Bud Face, PA-C 06/03/22 1217  Gwyneth Sprout, MD 06/05/22 [phone]

## 2022-05-31 NOTE — ED Provider Triage Note (Signed)
Emergency Medicine Provider Triage Evaluation Note  [name] , a 51 y.o. male  was evaluated in triage.  Pt complains of diffuse abdominal pain, nausea, and early saiety for the past week. He reports that he has been seeing a GI provider for this for 2 years and they do not know what is causing this. He reports that he was supposed to be seen at [facility] however they are "overbooked".  Review of Systems  Positive:  Negative:   Physical Exam  BP (!) 140/87 (BP Location: Right Arm)   Pulse 86   Temp 98.1 F (36.7 C) (Oral)   Resp 20   SpO2 98%  Gen:   Awake, no distress   Resp:  Normal effort  MSK:   Moves extremities without difficulty  Other:  Abdomen soft, non tender. No guarding or rebound.  Medical Decision Making  Medically screening exam initiated at 1:32 PM.  Appropriate orders placed.  [name] was informed that the remainder of the evaluation will be completed by another provider, this initial triage assessment does not replace that evaluation, and the importance of remaining in the ED until their evaluation is complete.  Will order labs. Will defer imaging until patient can be further evaluated in the main ED   Achille Rich, PA-C 05/31/22 1335

## 2022-05-31 NOTE — Discharge Instructions (Signed)
As we discussed, your work-up in the ER today was reassuring for acute abnormalities.  I have given you a referral to a gastroenterologist for you to call and schedule an appointment for continued evaluation and management of your symptoms.  However, I would also recommend that you call back in your gastroenterologist to see if they can get you in to see them faster than your scheduled appointment.  Return if development of any new or worsening symptoms.

## 2022-06-03 DIAGNOSIS — R6881 Early satiety: Secondary | ICD-10-CM | POA: Diagnosis not present

## 2022-06-03 DIAGNOSIS — K921 Melena: Secondary | ICD-10-CM | POA: Diagnosis not present

## 2022-06-03 DIAGNOSIS — R1084 Generalized abdominal pain: Secondary | ICD-10-CM | POA: Diagnosis not present

## 2022-06-03 DIAGNOSIS — R634 Abnormal weight loss: Secondary | ICD-10-CM | POA: Diagnosis not present

## 2022-06-05 DIAGNOSIS — R1013 Epigastric pain: Secondary | ICD-10-CM | POA: Diagnosis not present

## 2022-06-05 DIAGNOSIS — K219 Gastro-esophageal reflux disease without esophagitis: Secondary | ICD-10-CM | POA: Diagnosis not present

## 2022-06-05 DIAGNOSIS — R634 Abnormal weight loss: Secondary | ICD-10-CM | POA: Diagnosis not present

## 2022-06-05 DIAGNOSIS — K2930 Chronic superficial gastritis without bleeding: Secondary | ICD-10-CM | POA: Diagnosis not present

## 2022-06-05 DIAGNOSIS — K449 Diaphragmatic hernia without obstruction or gangrene: Secondary | ICD-10-CM | POA: Diagnosis not present

## 2022-06-12 ENCOUNTER — Telehealth: Payer: Self-pay | Admitting: *Deleted

## 2022-06-12 NOTE — Telephone Encounter (Signed)
PA Ubrelvy submitted on CMM. Key: [id number]. Waiting on determination from [facility] [employer].

## 2022-06-12 NOTE — Telephone Encounter (Signed)
PA approved effective from 06/12/2022 through 06/11/2023.

## 2022-06-25 DIAGNOSIS — E291 Testicular hypofunction: Secondary | ICD-10-CM | POA: Diagnosis not present

## 2022-06-25 DIAGNOSIS — Z125 Encounter for screening for malignant neoplasm of prostate: Secondary | ICD-10-CM | POA: Diagnosis not present

## 2022-06-26 ENCOUNTER — Encounter: Payer: Self-pay | Admitting: Neurology

## 2022-06-29 ENCOUNTER — Other Ambulatory Visit: Payer: Self-pay | Admitting: Neurology

## 2022-07-02 DIAGNOSIS — N5201 Erectile dysfunction due to arterial insufficiency: Secondary | ICD-10-CM | POA: Diagnosis not present

## 2022-07-02 DIAGNOSIS — D751 Secondary polycythemia: Secondary | ICD-10-CM | POA: Diagnosis not present

## 2022-07-02 DIAGNOSIS — E291 Testicular hypofunction: Secondary | ICD-10-CM | POA: Diagnosis not present

## 2022-07-03 DIAGNOSIS — R140 Abdominal distension (gaseous): Secondary | ICD-10-CM | POA: Diagnosis not present

## 2022-07-03 DIAGNOSIS — R6881 Early satiety: Secondary | ICD-10-CM | POA: Diagnosis not present

## 2022-07-03 DIAGNOSIS — R1013 Epigastric pain: Secondary | ICD-10-CM | POA: Diagnosis not present

## 2022-07-13 DIAGNOSIS — R140 Abdominal distension (gaseous): Secondary | ICD-10-CM | POA: Diagnosis not present

## 2022-07-13 DIAGNOSIS — R6881 Early satiety: Secondary | ICD-10-CM | POA: Diagnosis not present

## 2022-07-13 DIAGNOSIS — K30 Functional dyspepsia: Secondary | ICD-10-CM | POA: Diagnosis not present

## 2022-07-13 DIAGNOSIS — R1013 Epigastric pain: Secondary | ICD-10-CM | POA: Diagnosis not present

## 2022-07-22 ENCOUNTER — Other Ambulatory Visit: Payer: Self-pay | Admitting: Neurology

## 2022-07-27 DIAGNOSIS — M5412 Radiculopathy, cervical region: Secondary | ICD-10-CM | POA: Diagnosis not present

## 2022-07-27 DIAGNOSIS — M961 Postlaminectomy syndrome, not elsewhere classified: Secondary | ICD-10-CM | POA: Diagnosis not present

## 2022-07-27 DIAGNOSIS — M5030 Other cervical disc degeneration, unspecified cervical region: Secondary | ICD-10-CM | POA: Diagnosis not present

## 2022-07-27 DIAGNOSIS — M5416 Radiculopathy, lumbar region: Secondary | ICD-10-CM | POA: Diagnosis not present

## 2022-07-30 ENCOUNTER — Encounter: Payer: Self-pay | Admitting: Neurology

## 2022-07-30 ENCOUNTER — Ambulatory Visit: Payer: BC Managed Care – PPO | Admitting: Neurology

## 2022-07-30 VITALS — BP 134/76 | HR 81 | Ht 70.0 in | Wt 217.2 lb

## 2022-07-30 DIAGNOSIS — G4700 Insomnia, unspecified: Secondary | ICD-10-CM

## 2022-07-30 DIAGNOSIS — F4310 Post-traumatic stress disorder, unspecified: Secondary | ICD-10-CM

## 2022-07-30 DIAGNOSIS — Z9689 Presence of other specified functional implants: Secondary | ICD-10-CM | POA: Diagnosis not present

## 2022-07-30 DIAGNOSIS — G4730 Sleep apnea, unspecified: Secondary | ICD-10-CM | POA: Diagnosis not present

## 2022-07-30 DIAGNOSIS — T50B95A Adverse effect of other viral vaccines, initial encounter: Secondary | ICD-10-CM

## 2022-07-30 MED ORDER — AMITRIPTYLINE HCL 10 MG PO TABS: 20.0000 mg | ORAL_TABLET | Freq: Every day | ORAL | 5 refills | Status: AC

## 2022-07-30 MED ORDER — DIAZEPAM 10 MG PO TABS: ORAL_TABLET | ORAL | 5 refills | Status: AC
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
## 2022-08-06 DIAGNOSIS — M5416 Radiculopathy, lumbar region: Secondary | ICD-10-CM | POA: Diagnosis not present

## 2022-08-11 ENCOUNTER — Encounter: Payer: Self-pay | Admitting: Neurology

## 2022-08-11 DIAGNOSIS — Z8659 Personal history of other mental and behavioral disorders: Secondary | ICD-10-CM | POA: Diagnosis not present

## 2022-08-11 DIAGNOSIS — F5104 Psychophysiologic insomnia: Secondary | ICD-10-CM | POA: Diagnosis not present

## 2022-08-11 DIAGNOSIS — G4733 Obstructive sleep apnea (adult) (pediatric): Secondary | ICD-10-CM | POA: Diagnosis not present

## 2022-08-11 DIAGNOSIS — G8929 Other chronic pain: Secondary | ICD-10-CM | POA: Diagnosis not present

## 2022-08-12 NOTE — Telephone Encounter (Signed)
HST- Medicare no auth req/BCBS auth: [id number] (exp. 08/11/22 to 10/09/22) .  Patient is scheduled at [facility] for 09/01/22 at 9 AM.  Mailed packet to the patient.

## 2022-08-13 DIAGNOSIS — K3184 Gastroparesis: Secondary | ICD-10-CM | POA: Diagnosis not present

## 2022-08-13 DIAGNOSIS — K30 Functional dyspepsia: Secondary | ICD-10-CM | POA: Diagnosis not present

## 2022-08-19 ENCOUNTER — Ambulatory Visit: Payer: BC Managed Care – PPO | Admitting: Neurology

## 2022-08-31 ENCOUNTER — Ambulatory Visit: Payer: BC Managed Care – PPO | Admitting: Neurology

## 2022-09-01 ENCOUNTER — Ambulatory Visit: Payer: BC Managed Care – PPO | Admitting: Neurology

## 2022-09-01 DIAGNOSIS — F4310 Post-traumatic stress disorder, unspecified: Secondary | ICD-10-CM

## 2022-09-01 DIAGNOSIS — G4733 Obstructive sleep apnea (adult) (pediatric): Secondary | ICD-10-CM

## 2022-09-01 DIAGNOSIS — G4700 Insomnia, unspecified: Secondary | ICD-10-CM

## 2022-09-01 DIAGNOSIS — G4730 Sleep apnea, unspecified: Secondary | ICD-10-CM

## 2022-09-01 DIAGNOSIS — Z9689 Presence of other specified functional implants: Secondary | ICD-10-CM

## 2022-09-01 DIAGNOSIS — T50B95A Adverse effect of other viral vaccines, initial encounter: Secondary | ICD-10-CM

## 2022-09-03 ENCOUNTER — Encounter: Payer: Self-pay | Admitting: Neurology

## 2022-09-03 NOTE — Progress Notes (Signed)
See procedure note.

## 2022-09-06 NOTE — Procedures (Signed)
   [facility]  HOME SLEEP TEST (Watch PAT) REPORT  STUDY DATE: 09/01/22  DOB: 1971/08/19  MRN: [id number]  ORDERING CLINICIAN: Huston Foley, MD, PhD   REFERRING CLINICIAN: Tally Joe, MD (PCP), Dr. Vickey Huger (Sleep)  CLINICAL INFORMATION/HISTORY: 51 yo male with a Hx of HTN, HLP, Low T, chronic neck pain, migraine headache and obesity, who was previously diagnosed with mild obstructive sleep apnea. He has not been on PAP therapy. He presents for re-evaluation.   BMI: 34.1 kg/m  FINDINGS:   Sleep Summary:   Total Recording Time (hours, min): 9 hours, 2 min  Total Sleep Time (hours, min):  6 hours, 27 min  Percent REM (%):    15.4%   Respiratory Indices:   Calculated pAHI (per hour):  5.6/hour         REM pAHI:    17.4/hour       NREM pAHI: 3.4/hour  Central pAHI: 0/hour  Oxygen Saturation Statistics:    Oxygen Saturation (%) Mean: 95%   Minimum oxygen saturation (%):                 89%   O2 Saturation Range (%): 89 - 98%    O2 Saturation (minutes) <=88%: 0 min  Pulse Rate Statistics:   Pulse Mean (bpm):    72/min    Pulse Range (58 - 94/min)   IMPRESSION: OSA (obstructive sleep apnea), mild  RECOMMENDATION:  This HST shows borderline obstructive sleep apnea, with an AHI of 5.6/hour (1.6/hour, by 4% desaturation criteria) and O2 nadir of 89%. Treatment with positive airway pressure can be considered with autoPAP, if desired by patient. Treatment options otherwise include weight loss and avoidance of the supine sleep position or a dental devise. These different avenues will be discussed with the patient. The patient will be seen in follow up in sleep clinic, if necessary. Please note, that other causes of the patient's symptoms, including circadian rhythm disturbances, an underlying mood disorder, medication effect and/or an underlying medical problem cannot be ruled out based on this test. Clinical correlation is recommended. The patient  should be cautioned not to drive, work at heights, or operate dangerous or heavy equipment when tired or sleepy. Review and reiteration of good sleep hygiene measures should be pursued with any patient. The referring provider will be notified of the test results.   I certify that I have reviewed the raw data recording prior to the issuance of this report in accordance with the standards of the American Academy of Sleep Medicine (AASM).  INTERPRETING PHYSICIAN:   Huston Foley, MD, PhD Medical Director, [facility]) Diplomat, ABPN (Neurology and Sleep)   [facility] [address], [address] [place], [place] [address] [phone]

## 2022-09-07 NOTE — Telephone Encounter (Signed)
LVM for pt to call to go over sleep study results. Also advised I will send via mychart and if he prefers, he can reply via mychart.

## 2022-09-22 DIAGNOSIS — F410 Panic disorder [episodic paroxysmal anxiety] without agoraphobia: Secondary | ICD-10-CM | POA: Diagnosis not present

## 2022-09-22 DIAGNOSIS — F5101 Primary insomnia: Secondary | ICD-10-CM | POA: Diagnosis not present

## 2022-09-22 DIAGNOSIS — F341 Dysthymic disorder: Secondary | ICD-10-CM | POA: Diagnosis not present

## 2022-10-12 DIAGNOSIS — F341 Dysthymic disorder: Secondary | ICD-10-CM | POA: Diagnosis not present

## 2022-10-12 DIAGNOSIS — F5101 Primary insomnia: Secondary | ICD-10-CM | POA: Diagnosis not present

## 2022-10-12 DIAGNOSIS — F410 Panic disorder [episodic paroxysmal anxiety] without agoraphobia: Secondary | ICD-10-CM | POA: Diagnosis not present

## 2022-11-02 DIAGNOSIS — F410 Panic disorder [episodic paroxysmal anxiety] without agoraphobia: Secondary | ICD-10-CM | POA: Diagnosis not present

## 2022-11-02 DIAGNOSIS — F341 Dysthymic disorder: Secondary | ICD-10-CM | POA: Diagnosis not present

## 2022-11-02 DIAGNOSIS — F5101 Primary insomnia: Secondary | ICD-10-CM | POA: Diagnosis not present

## 2022-11-26 DIAGNOSIS — M961 Postlaminectomy syndrome, not elsewhere classified: Secondary | ICD-10-CM | POA: Diagnosis not present

## 2022-11-26 DIAGNOSIS — M5030 Other cervical disc degeneration, unspecified cervical region: Secondary | ICD-10-CM | POA: Diagnosis not present

## 2022-11-26 DIAGNOSIS — M5416 Radiculopathy, lumbar region: Secondary | ICD-10-CM | POA: Diagnosis not present

## 2022-11-27 DIAGNOSIS — K3184 Gastroparesis: Secondary | ICD-10-CM | POA: Diagnosis not present

## 2022-11-27 DIAGNOSIS — K30 Functional dyspepsia: Secondary | ICD-10-CM | POA: Diagnosis not present

## 2022-12-18 DIAGNOSIS — Z125 Encounter for screening for malignant neoplasm of prostate: Secondary | ICD-10-CM | POA: Diagnosis not present

## 2022-12-18 DIAGNOSIS — E291 Testicular hypofunction: Secondary | ICD-10-CM | POA: Diagnosis not present

## 2022-12-25 DIAGNOSIS — N5201 Erectile dysfunction due to arterial insufficiency: Secondary | ICD-10-CM | POA: Diagnosis not present

## 2022-12-25 DIAGNOSIS — E291 Testicular hypofunction: Secondary | ICD-10-CM | POA: Diagnosis not present

## 2022-12-29 DIAGNOSIS — M5416 Radiculopathy, lumbar region: Secondary | ICD-10-CM | POA: Diagnosis not present

## 2023-01-11 DIAGNOSIS — R051 Acute cough: Secondary | ICD-10-CM | POA: Diagnosis not present

## 2023-01-11 DIAGNOSIS — J019 Acute sinusitis, unspecified: Secondary | ICD-10-CM | POA: Diagnosis not present

## 2023-01-11 DIAGNOSIS — J209 Acute bronchitis, unspecified: Secondary | ICD-10-CM | POA: Diagnosis not present

## 2023-01-13 DIAGNOSIS — J189 Pneumonia, unspecified organism: Secondary | ICD-10-CM | POA: Diagnosis not present

## 2023-01-13 DIAGNOSIS — R051 Acute cough: Secondary | ICD-10-CM | POA: Diagnosis not present

## 2023-01-13 DIAGNOSIS — R0602 Shortness of breath: Secondary | ICD-10-CM | POA: Diagnosis not present

## 2023-01-13 DIAGNOSIS — R062 Wheezing: Secondary | ICD-10-CM | POA: Diagnosis not present

## 2023-01-13 DIAGNOSIS — R0981 Nasal congestion: Secondary | ICD-10-CM | POA: Diagnosis not present

## 2023-01-15 ENCOUNTER — Other Ambulatory Visit: Payer: Self-pay

## 2023-01-15 ENCOUNTER — Encounter (HOSPITAL_COMMUNITY): Payer: Self-pay

## 2023-01-15 ENCOUNTER — Emergency Department (HOSPITAL_COMMUNITY)
Admission: EM | Admit: 2023-01-15 | Discharge: 2023-01-15 | Disposition: A | Discharge status: Home / Self Care | Payer: BC Managed Care – PPO | Attending: Emergency Medicine | Admitting: Emergency Medicine

## 2023-01-15 ENCOUNTER — Emergency Department (HOSPITAL_COMMUNITY): Payer: BC Managed Care – PPO

## 2023-01-15 DIAGNOSIS — D72829 Elevated white blood cell count, unspecified: Secondary | ICD-10-CM

## 2023-01-15 DIAGNOSIS — I10 Essential (primary) hypertension: Secondary | ICD-10-CM | POA: Diagnosis not present

## 2023-01-15 DIAGNOSIS — Z79899 Other long term (current) drug therapy: Secondary | ICD-10-CM | POA: Diagnosis not present

## 2023-01-15 DIAGNOSIS — R051 Acute cough: Secondary | ICD-10-CM | POA: Diagnosis not present

## 2023-01-15 DIAGNOSIS — Z0389 Encounter for observation for other suspected diseases and conditions ruled out: Secondary | ICD-10-CM | POA: Diagnosis not present

## 2023-01-15 DIAGNOSIS — R059 Cough, unspecified: Secondary | ICD-10-CM | POA: Diagnosis not present

## 2023-01-15 LAB — CBC WITH DIFFERENTIAL/PLATELET
Abs Immature Granulocytes: 0.79 10*3/uL — ABNORMAL HIGH (ref 0.00–0.07)
Basophils Absolute: 0.1 10*3/uL (ref 0.0–0.1)
Basophils Relative: 1 %
Eosinophils Absolute: 0 10*3/uL (ref 0.0–0.5)
Eosinophils Relative: 0 %
HCT: 49.9 % (ref 39.0–52.0)
Hemoglobin: 15.7 g/dL (ref 13.0–17.0)
Immature Granulocytes: 4 %
Lymphocytes Relative: 15 %
Lymphs Abs: 2.9 10*3/uL (ref 0.7–4.0)
MCH: 28 pg (ref 26.0–34.0)
MCHC: 31.5 g/dL (ref 30.0–36.0)
MCV: 88.9 fL (ref 80.0–100.0)
Monocytes Absolute: 1.3 10*3/uL — ABNORMAL HIGH (ref 0.1–1.0)
Monocytes Relative: 7 %
Neutro Abs: 14.3 10*3/uL — ABNORMAL HIGH (ref 1.7–7.7)
Neutrophils Relative %: 73 %
Platelets: 299 10*3/uL (ref 150–400)
RBC: 5.61 MIL/uL (ref 4.22–5.81)
RDW: 13.2 % (ref 11.5–15.5)
WBC: 19.4 10*3/uL — ABNORMAL HIGH (ref 4.0–10.5)
nRBC: 0.2 % (ref 0.0–0.2)

## 2023-01-15 LAB — COMPREHENSIVE METABOLIC PANEL
ALT: 241 U/L — ABNORMAL HIGH (ref 0–44)
AST: 74 U/L — ABNORMAL HIGH (ref 15–41)
Albumin: 4.1 g/dL (ref 3.5–5.0)
Alkaline Phosphatase: 84 U/L (ref 38–126)
Anion gap: 12 (ref 5–15)
BUN: 17 mg/dL (ref 6–20)
CO2: 27 mmol/L (ref 22–32)
Calcium: 9 mg/dL (ref 8.9–10.3)
Chloride: 98 mmol/L (ref 98–111)
Creatinine, Ser: 1.46 mg/dL — ABNORMAL HIGH (ref 0.61–1.24)
GFR, Estimated: 57 mL/min — ABNORMAL LOW (ref >60)
Glucose, Bld: 117 mg/dL — ABNORMAL HIGH (ref 70–99)
Potassium: 4 mmol/L (ref 3.5–5.1)
Sodium: 137 mmol/L (ref 135–145)
Total Bilirubin: 0.5 mg/dL (ref 0.3–1.2)
Total Protein: 7.5 g/dL (ref 6.5–8.1)

## 2023-01-15 MED ORDER — ALBUTEROL SULFATE (2.5 MG/3ML) 0.083% IN NEBU
5.0000 mg | INHALATION_SOLUTION | Freq: Once | RESPIRATORY_TRACT | Status: AC
  Administered 2023-01-15: 5 mg via RESPIRATORY_TRACT
  Filled 2023-01-15: qty 6

## 2023-01-15 MED ORDER — IPRATROPIUM BROMIDE 0.02 % IN SOLN
0.5000 mg | Freq: Once | RESPIRATORY_TRACT | Status: AC
  Administered 2023-01-15: 0.5 mg via RESPIRATORY_TRACT
  Filled 2023-01-15: qty 2.5

## 2023-01-15 MED ORDER — SODIUM CHLORIDE 0.9 % IV BOLUS
1000.0000 mL | Freq: Once | INTRAVENOUS | Status: AC
  Administered 2023-01-15: 1000 mL via INTRAVENOUS

## 2023-01-15 MED ORDER — HYDROCODONE BIT-HOMATROP MBR 5-1.5 MG/5ML PO SOLN: 5.0000 mL | Freq: Once | ORAL | Status: DC

## 2023-01-15 MED ORDER — BENZONATATE 100 MG PO CAPS
200.0000 mg | ORAL_CAPSULE | Freq: Once | ORAL | Status: AC
  Administered 2023-01-15: 200 mg via ORAL
  Filled 2023-01-15: qty 2

## 2023-01-15 MED ORDER — HYDROCODONE BIT-HOMATROP MBR 5-1.5 MG/5ML PO SOLN: 5.0000 mL | Freq: Four times a day (QID) | ORAL | 0 refills | Status: DC | PRN

## 2023-01-15 MED ORDER — HYDROCOD POLI-CHLORPHE POLI ER 10-8 MG/5ML PO SUER
5.0000 mL | Freq: Once | ORAL | Status: AC
  Administered 2023-01-15: 5 mL via ORAL
  Filled 2023-01-15: qty 5

## 2023-01-15 NOTE — ED Triage Notes (Signed)
Patient said he has pneumonia  and is coughing up thick green mucus. Started antibiotics Monday and does not feel like he is getting any better.

## 2023-01-15 NOTE — Discharge Instructions (Signed)
As we discussed, your white blood cell count is elevated likely from taking prednisone.  I recommend that you finish taking prednisone and also your antibiotics as prescribed by your doctor.  Your chest x-ray today did not show any pneumonia.  I have prescribed Hycodan as needed for cough.  It also is a sedative so please do not drive with it   Please follow-up with your doctor early next week  Return to ER if you have worse cough or fever or trouble breathing

## 2023-01-15 NOTE — ED Provider Notes (Signed)
[place] EMERGENCY DEPARTMENT AT [facility] Provider Note   CSN: [id number] Arrival date & time: 01/15/23  1631     History  Chief Complaint  Patient presents with   Cough    [name] is a 52 y.o. male history of hypertension, here presenting with cough.  Patient has been having persistent cough for the last week or so.  Patient went to see primary care doctor and was thought to have pneumonia.  Patient was started on Augmentin and azithromycin as well as prednisone for possible pneumonia versus bronchitis.  He is also on Phenergan for cough as well.  He states that he has been compliant with his medicines but he still has cough that wakes him up at night.  Patient was sent in by primary care doctor for further evaluation.  The history is provided by the patient.       Home Medications Prior to Admission medications   Medication Sig Start Date End Date Taking? Authorizing Provider  amitriptyline (ELAVIL) 10 MG tablet Take 2 tablets (20 mg total) by mouth at bedtime. 07/30/22   Dohmeier, Porfirio Mylar, MD  amLODipine (NORVASC) 10 MG tablet Take 10 mg by mouth at bedtime.  12/20/12   [provider]  diazepam (VALIUM) 10 MG tablet TAKE 1/2 TABLET IN MORNING AND 1 AND 1/2 TABLETS AT BEDTIME 07/30/22   Dohmeier, Porfirio Mylar, MD  Eszopiclone 3 MG TABS Take 1 tablet (3 mg total) by mouth at bedtime as needed. Take immediately before bedtime 03/23/22   Dohmeier, Porfirio Mylar, MD  hydrochlorothiazide (HYDRODIURIL) 25 MG tablet Take 25 mg by mouth daily.    [provider]  metoCLOPramide (REGLAN) 5 MG tablet Take 5 mg by mouth 4 (four) times daily. Takes 1 tablet 15 min before meals and 1 before bedtime    [provider]  rosuvastatin (CRESTOR) 5 MG tablet Take 5 mg by mouth daily with supper. 01/27/17   [provider]  testosterone cypionate (DEPOTESTOTERONE CYPIONATE) 100 MG/ML injection Inject 100 mg into the muscle every 7 (seven) days. Takes on  Fridays. For IM use only.    [provider]  Ubrogepant (UBRELVY) 100 MG TABS Take 100 mg by mouth as needed (take 1 tablet at onset of migraine. May repeat in 2 hours if needed. (Do not exceed more than 2 tab in 24 hrs)). 03/23/22   Dohmeier, Porfirio Mylar, MD      Allergies    Losartan, Alprazolam, Hydrocodone-acetaminophen, Tiagabine, Covid-19 mrna vacc (moderna), and Percocet [oxycodone-acetaminophen]    Review of Systems   Review of Systems  Respiratory:  Positive for cough.   All other systems reviewed and are negative.   Physical Exam Updated Vital Signs BP (!) 164/93   Pulse 73   Temp 98.5 F (36.9 C) (Oral)   Resp 16   Ht 5\' 10"  (1.778 m)   Wt 99 kg   SpO2 95%   BMI 31.32 kg/m  Physical Exam Vitals and nursing note reviewed.  Constitutional:      Comments: Coughing  HENT:     Head: Normocephalic.     Nose: Nose normal.     Mouth/Throat:     Mouth: Mucous membranes are moist.  Eyes:     Extraocular Movements: Extraocular movements intact.     Pupils: Pupils are equal, round, and reactive to light.  Cardiovascular:     Rate and Rhythm: Normal rate and regular rhythm.     Pulses: Normal pulses.     Heart  sounds: Normal heart sounds.  Pulmonary:     Comments: Slightly tachypneic and mild diffuse wheezing.  No retractions or crackles Abdominal:     General: Abdomen is flat.     Palpations: Abdomen is soft.  Musculoskeletal:        General: Normal range of motion.     Cervical back: Normal range of motion and neck supple.  Skin:    General: Skin is warm.     Capillary Refill: Capillary refill takes less than 2 seconds.  Neurological:     General: No focal deficit present.     Mental Status: He is oriented to person, place, and time.  Psychiatric:        Mood and Affect: Mood normal.        Behavior: Behavior normal.     ED Results / Procedures / Treatments   Labs (all labs ordered are listed, but only abnormal results are displayed) Labs Reviewed   CBC WITH DIFFERENTIAL/PLATELET - Abnormal; Notable for the following components:      Result Value   WBC 19.4 (*)    Neutro Abs 14.3 (*)    Monocytes Absolute 1.3 (*)    Abs Immature Granulocytes 0.79 (*)    All other components within normal limits  COMPREHENSIVE METABOLIC PANEL    EKG None  Radiology DG Chest 2 View  Result Date: 01/15/2023 CLINICAL DATA:  possible PNA EXAM: CHEST - 2 VIEW COMPARISON:  11/03/2020 FINDINGS: Cardiac silhouette is unremarkable. No pneumothorax or pleural effusion. The lungs are clear. The visualized skeletal structures are unremarkable. Cervical spine electronic stimulation wires are seen. IMPRESSION: No acute cardiopulmonary process. Electronically Signed   By: Layla Maw M.D.   On: 01/15/2023 17:25    Procedures Procedures    Medications Ordered in ED Medications  sodium chloride 0.9 % bolus 1,000 mL (1,000 mLs Intravenous New Bag/Given 01/15/23 2014)  albuterol (PROVENTIL) (2.5 MG/3ML) 0.083% nebulizer solution 5 mg (5 mg Nebulization Given 01/15/23 2014)  ipratropium (ATROVENT) nebulizer solution 0.5 mg (0.5 mg Nebulization Given 01/15/23 2013)  benzonatate (TESSALON) capsule 200 mg (200 mg Oral Given 01/15/23 2014)  chlorpheniramine-HYDROcodone (TUSSIONEX) 10-8 MG/5ML suspension 5 mL (5 mLs Oral Given 01/15/23 2015)    ED Course/ Medical Decision Making/ A&P                             Medical Decision Making [name] is a 52 y.o. male here presenting with cough.  Patient has been coughing for the last week or so.  He is already on antibiotics and steroids.  I wonder if he has a viral bronchitis.  Plan to get CBC and CMP and get a chest x-ray.  Will give DuoNeb and cough medicine and reassess  9:20 PM I reviewed patient's labs and white blood cell count is 19.  However his chest x-ray did not show any obvious pneumonia.  I think his leukocytosis likely is secondary to his steroid use and stress reaction.  I do not think he is  septic.  Patient's coughing is better after DuoNeb and cough medicine.  At this point we will prescribe Hycodan as needed.  Told him to finish his steroids and antibiotics as prescribed by his doctor.  Problems Addressed: Acute cough: acute illness or injury Leukocytosis, unspecified type: acute illness or injury  Amount and/or Complexity of Data Reviewed Labs: ordered. Decision-making details documented in ED Course.  Risk Prescription drug management.  Final Clinical Impression(s) / ED Diagnoses Final diagnoses:  None    Rx / DC Orders ED Discharge Orders     None         Charlynne Pander, MD 01/15/23 2121

## 2023-01-20 DIAGNOSIS — R062 Wheezing: Secondary | ICD-10-CM | POA: Diagnosis not present

## 2023-01-20 DIAGNOSIS — R059 Cough, unspecified: Secondary | ICD-10-CM | POA: Diagnosis not present

## 2023-01-20 DIAGNOSIS — J189 Pneumonia, unspecified organism: Secondary | ICD-10-CM | POA: Diagnosis not present

## 2023-01-27 ENCOUNTER — Ambulatory Visit: Payer: BC Managed Care – PPO | Admitting: Neurology

## 2023-02-01 DIAGNOSIS — F5101 Primary insomnia: Secondary | ICD-10-CM | POA: Diagnosis not present

## 2023-02-01 DIAGNOSIS — F341 Dysthymic disorder: Secondary | ICD-10-CM | POA: Diagnosis not present

## 2023-02-01 DIAGNOSIS — F410 Panic disorder [episodic paroxysmal anxiety] without agoraphobia: Secondary | ICD-10-CM | POA: Diagnosis not present

## 2023-02-02 DIAGNOSIS — J3489 Other specified disorders of nose and nasal sinuses: Secondary | ICD-10-CM | POA: Diagnosis not present

## 2023-02-02 DIAGNOSIS — L089 Local infection of the skin and subcutaneous tissue, unspecified: Secondary | ICD-10-CM | POA: Diagnosis not present

## 2023-02-11 DIAGNOSIS — E559 Vitamin D deficiency, unspecified: Secondary | ICD-10-CM | POA: Diagnosis not present

## 2023-02-11 DIAGNOSIS — Z0000 Encounter for general adult medical examination without abnormal findings: Secondary | ICD-10-CM | POA: Diagnosis not present

## 2023-02-11 DIAGNOSIS — M542 Cervicalgia: Secondary | ICD-10-CM | POA: Diagnosis not present

## 2023-02-11 DIAGNOSIS — E782 Mixed hyperlipidemia: Secondary | ICD-10-CM | POA: Diagnosis not present

## 2023-02-11 DIAGNOSIS — K589 Irritable bowel syndrome without diarrhea: Secondary | ICD-10-CM | POA: Diagnosis not present

## 2023-02-11 DIAGNOSIS — I10 Essential (primary) hypertension: Secondary | ICD-10-CM | POA: Diagnosis not present

## 2023-03-02 DIAGNOSIS — F5101 Primary insomnia: Secondary | ICD-10-CM | POA: Diagnosis not present

## 2023-03-02 DIAGNOSIS — F341 Dysthymic disorder: Secondary | ICD-10-CM | POA: Diagnosis not present

## 2023-03-02 DIAGNOSIS — F410 Panic disorder [episodic paroxysmal anxiety] without agoraphobia: Secondary | ICD-10-CM | POA: Diagnosis not present

## 2023-04-07 DIAGNOSIS — F410 Panic disorder [episodic paroxysmal anxiety] without agoraphobia: Secondary | ICD-10-CM | POA: Diagnosis not present

## 2023-04-07 DIAGNOSIS — F5101 Primary insomnia: Secondary | ICD-10-CM | POA: Diagnosis not present

## 2023-04-07 DIAGNOSIS — F341 Dysthymic disorder: Secondary | ICD-10-CM | POA: Diagnosis not present

## 2023-04-13 DIAGNOSIS — H2513 Age-related nuclear cataract, bilateral: Secondary | ICD-10-CM | POA: Diagnosis not present

## 2023-04-13 DIAGNOSIS — R519 Headache, unspecified: Secondary | ICD-10-CM | POA: Diagnosis not present

## 2023-04-13 DIAGNOSIS — H02403 Unspecified ptosis of bilateral eyelids: Secondary | ICD-10-CM | POA: Diagnosis not present

## 2023-04-13 DIAGNOSIS — H524 Presbyopia: Secondary | ICD-10-CM | POA: Diagnosis not present

## 2023-04-13 DIAGNOSIS — H04123 Dry eye syndrome of bilateral lacrimal glands: Secondary | ICD-10-CM | POA: Diagnosis not present

## 2023-04-20 ENCOUNTER — Other Ambulatory Visit: Payer: Self-pay

## 2023-04-20 ENCOUNTER — Emergency Department (HOSPITAL_BASED_OUTPATIENT_CLINIC_OR_DEPARTMENT_OTHER)
Admission: EM | Admit: 2023-04-20 | Discharge: 2023-04-21 | Disposition: A | Discharge status: Home / Self Care | Payer: BC Managed Care – PPO | Attending: Emergency Medicine | Admitting: Emergency Medicine

## 2023-04-20 ENCOUNTER — Emergency Department (HOSPITAL_BASED_OUTPATIENT_CLINIC_OR_DEPARTMENT_OTHER): Payer: BC Managed Care – PPO

## 2023-04-20 ENCOUNTER — Encounter (HOSPITAL_BASED_OUTPATIENT_CLINIC_OR_DEPARTMENT_OTHER): Payer: Self-pay

## 2023-04-20 DIAGNOSIS — Z79899 Other long term (current) drug therapy: Secondary | ICD-10-CM | POA: Insufficient documentation

## 2023-04-20 DIAGNOSIS — R109 Unspecified abdominal pain: Secondary | ICD-10-CM | POA: Diagnosis not present

## 2023-04-20 DIAGNOSIS — I10 Essential (primary) hypertension: Secondary | ICD-10-CM | POA: Diagnosis not present

## 2023-04-20 DIAGNOSIS — R1084 Generalized abdominal pain: Secondary | ICD-10-CM | POA: Diagnosis not present

## 2023-04-20 DIAGNOSIS — R140 Abdominal distension (gaseous): Secondary | ICD-10-CM | POA: Diagnosis not present

## 2023-04-20 DIAGNOSIS — R0602 Shortness of breath: Secondary | ICD-10-CM | POA: Diagnosis not present

## 2023-04-20 DIAGNOSIS — D72829 Elevated white blood cell count, unspecified: Secondary | ICD-10-CM | POA: Insufficient documentation

## 2023-04-20 LAB — URINALYSIS, ROUTINE W REFLEX MICROSCOPIC
Bilirubin Urine: NEGATIVE
Glucose, UA: 250 mg/dL — AB
Hgb urine dipstick: NEGATIVE
Ketones, ur: NEGATIVE mg/dL
Leukocytes,Ua: NEGATIVE
Nitrite: NEGATIVE
Protein, ur: 30 mg/dL — AB
Specific Gravity, Urine: 1.02 (ref 1.005–1.030)
pH: 7.5 (ref 5.0–8.0)

## 2023-04-20 LAB — CBC
HCT: 47.1 % (ref 39.0–52.0)
Hemoglobin: 14.8 g/dL (ref 13.0–17.0)
MCH: 27.8 pg (ref 26.0–34.0)
MCHC: 31.4 g/dL (ref 30.0–36.0)
MCV: 88.5 fL (ref 80.0–100.0)
Platelets: 238 10*3/uL (ref 150–400)
RBC: 5.32 MIL/uL (ref 4.22–5.81)
RDW: 14.5 % (ref 11.5–15.5)
WBC: 12.7 10*3/uL — ABNORMAL HIGH (ref 4.0–10.5)
nRBC: 0 % (ref 0.0–0.2)

## 2023-04-20 LAB — COMPREHENSIVE METABOLIC PANEL
ALT: 73 U/L — ABNORMAL HIGH (ref 0–44)
AST: 72 U/L — ABNORMAL HIGH (ref 15–41)
Albumin: 4 g/dL (ref 3.5–5.0)
Alkaline Phosphatase: 92 U/L (ref 38–126)
Anion gap: 11 (ref 5–15)
BUN: 13 mg/dL (ref 6–20)
CO2: 23 mmol/L (ref 22–32)
Calcium: 8.7 mg/dL — ABNORMAL LOW (ref 8.9–10.3)
Chloride: 104 mmol/L (ref 98–111)
Creatinine, Ser: 1.18 mg/dL (ref 0.61–1.24)
GFR, Estimated: >60 mL/min (ref >60)
Glucose, Bld: 174 mg/dL — ABNORMAL HIGH (ref 70–99)
Potassium: 3.4 mmol/L — ABNORMAL LOW (ref 3.5–5.1)
Sodium: 138 mmol/L (ref 135–145)
Total Bilirubin: 0.5 mg/dL (ref 0.3–1.2)
Total Protein: 6.6 g/dL (ref 6.5–8.1)

## 2023-04-20 LAB — LIPASE, BLOOD: Lipase: 43 U/L (ref 11–51)

## 2023-04-20 LAB — URINALYSIS, MICROSCOPIC (REFLEX)

## 2023-04-20 MED ORDER — ACETAMINOPHEN 325 MG PO TABS
650.0000 mg | ORAL_TABLET | Freq: Once | ORAL | Status: AC
  Administered 2023-04-21: 650 mg via ORAL
  Filled 2023-04-20: qty 2

## 2023-04-20 NOTE — ED Notes (Signed)
Patient transported to X-ray 

## 2023-04-20 NOTE — ED Triage Notes (Signed)
Pt prescribed amitriptylene  and reglan for gastroparesis. Pt reports flare up for past month. Lower abdominal pain has been unmanageable. Pt also reports abdominal distention. Denies N/V/fever. Appt set up on Thursday with GI physician.

## 2023-04-20 NOTE — ED Notes (Signed)
ED Provider at bedside. 

## 2023-04-21 DIAGNOSIS — R140 Abdominal distension (gaseous): Secondary | ICD-10-CM | POA: Diagnosis not present

## 2023-04-21 DIAGNOSIS — R109 Unspecified abdominal pain: Secondary | ICD-10-CM | POA: Diagnosis not present

## 2023-04-21 NOTE — Discharge Instructions (Signed)

## 2023-04-21 NOTE — ED Notes (Signed)
Pt reports having a "gastroparesis" flare up.  This has last approx a month.   Normal Bms, denies nausea.  Pain and abd distension

## 2023-04-21 NOTE — ED Provider Notes (Signed)
[place] EMERGENCY DEPARTMENT AT [facility] Provider Note   CSN: [id number] Arrival date & time: 04/20/23  4132     History  Chief Complaint  Patient presents with   Abdominal Pain    [name] is a 52 y.o. male.  The history is provided by the patient.   Patient with extensive history including hypertension, hyperlipidemia presents with abdominal pain he has had for years.  He reports about 3 years ago he had an adverse reaction to the COVID-vaccine.  Since that time he has had recurring episodes of abdominal pain and has been diagnosed with gastroparesis This episode worsened over the past several weeks.  No fevers or vomiting.  He denies any nausea.  He reports he feels bloated and some lower abdominal pain.  No urinary symptoms.  He is having normal formed bowel movements without any blood or melena.  No previous abdominal surgeries. He takes Reglan and TCAs for his symptoms without any relief.  He has follow-up with gastroenterology in 48 hours  His biggest concern is that he feels bloated although he reports weight loss over the past month.  He also reports that the pain and pressure starts in his abdomen and goes into his chest and he feels short of breath.  No chest pain.    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 Authorizing Provider  amitriptyline (ELAVIL) 10  MG tablet Take 2 tablets (20 mg total) by mouth at bedtime. 07/30/22   Dohmeier, Porfirio Mylar, MD  amLODipine (NORVASC) 10 MG tablet Take 10 mg by mouth at bedtime.  12/20/12   [provider]  diazepam (VALIUM) 10 MG tablet TAKE 1/2 TABLET IN MORNING AND 1 AND 1/2 TABLETS AT BEDTIME 07/30/22   Dohmeier, Porfirio Mylar, MD  Eszopiclone 3 MG TABS Take 1 tablet (3 mg total) by mouth at bedtime as needed. Take immediately before bedtime 03/23/22   Dohmeier, Porfirio Mylar, MD  hydrochlorothiazide (HYDRODIURIL) 25 MG tablet Take 25 mg by mouth daily.    [provider]  HYDROcodone bit-homatropine (HYCODAN) 5-1.5 MG/5ML syrup Take 5 mLs by mouth every 6 (six) hours as needed for cough. 01/15/23   Charlynne Pander, MD  metoCLOPramide (REGLAN) 5 MG tablet Take 5 mg by mouth 4 (four) times daily. Takes 1 tablet 15 min before meals and 1 before bedtime    [provider]  rosuvastatin (CRESTOR) 5 MG tablet Take 5 mg by mouth daily with supper. 01/27/17   [provider]  testosterone cypionate (DEPOTESTOTERONE CYPIONATE) 100 MG/ML injection Inject 100 mg into the muscle every 7 (seven) days. Takes on Fridays. For IM use only.    [provider]  Ubrogepant (UBRELVY) 100 MG TABS Take 100 mg by mouth as needed (take 1 tablet at onset  of migraine. May repeat in 2 hours if needed. (Do not exceed more than 2 tab in 24 hrs)). 03/23/22   Dohmeier, Porfirio Mylar, MD      Allergies    Losartan, Alprazolam, Hydrocodone-acetaminophen, Tiagabine, Covid-19 mrna vacc (moderna), and Percocet [oxycodone-acetaminophen]    Review of Systems   Review of Systems  Constitutional:  Positive for appetite change and unexpected weight change. Negative for fever.  Respiratory:  Positive for shortness of breath.   Cardiovascular:  Negative for chest pain.  Gastrointestinal:  Positive for abdominal distention and abdominal pain. Negative for blood in stool, constipation, diarrhea, nausea and vomiting.  Genitourinary:   Negative for dysuria.    Physical Exam Updated Vital Signs BP (!) 162/86 (BP Location: Right Arm)   Pulse 75   Temp (!) 97.5 F (36.4 C) (Oral)   Resp (!) 25   Ht 1.778 m (5\' 10" )   Wt 113.4 kg   SpO2 98%   BMI 35.87 kg/m  Physical Exam CONSTITUTIONAL: Well developed/well nourished, no distress HEAD: Normocephalic/atraumatic EYES: EOMI/PERRL ENMT: Mucous membranes moist NECK: supple no meningeal signs CV: S1/S2 noted, no murmurs/rubs/gallops noted LUNGS: Lungs are clear to auscultation bilaterally, no apparent distress ABDOMEN: soft, mild distention, no rebound no guarding.  Bowel sounds are noted throughout the abdomen.  No significant tenderness is noted. GU:no cva tenderness NEURO: Pt is awake/alert/appropriate, moves all extremitiesx4.  No facial droop.   EXTREMITIES: pulses normal/equal, full ROM, no lower extremity edema SKIN: warm, color normal PSYCH: no abnormalities of mood noted, alert and oriented to situation  ED Results / Procedures / Treatments   Labs (all labs ordered are listed, but only abnormal results are displayed) Labs Reviewed  COMPREHENSIVE METABOLIC PANEL - Abnormal; Notable for the following components:      Result Value   Potassium 3.4 (*)    Glucose, Bld 174 (*)    Calcium 8.7 (*)    AST 72 (*)    ALT 73 (*)    All other components within normal limits  CBC - Abnormal; Notable for the following components:   WBC 12.7 (*)    All other components within normal limits  URINALYSIS, ROUTINE W REFLEX MICROSCOPIC - Abnormal; Notable for the following components:   Glucose, UA 250 (*)    Protein, ur 30 (*)    All other components within normal limits  URINALYSIS, MICROSCOPIC (REFLEX) - Abnormal; Notable for the following components:   Bacteria, UA RARE (*)    All other components within normal limits  LIPASE, BLOOD    EKG EKG Interpretation Date/Time:  Wednesday April 21 2023 00:04:59 EDT Ventricular Rate:  78 PR Interval:  164 QRS  Duration:  110 QT Interval:  393 QTC Calculation: 448 R Axis:   91  Text Interpretation: Sinus rhythm Borderline right axis deviation No significant change since last tracing Confirmed by Zadie Rhine ([id number]) on 04/21/2023 12:18:25 AM  Radiology DG ABD ACUTE 2+V W 1V CHEST  Result Date: 04/21/2023 CLINICAL DATA:  Pain and bloating EXAM: DG ABDOMEN ACUTE WITH 1 VIEW CHEST COMPARISON:  CT abdomen pelvis 05/31/2022 FINDINGS: Gaseous distention of loops of small bowel in the left lower quadrant at the upper limits of normal in caliber. If there is concern for obstruction CT is recommended. No radiopaque calculi or other significant radiographic abnormality is seen. Heart size and mediastinal contours are within normal limits. Both lungs are clear. Spinal cord stimulator. IMPRESSION: Gaseous distention of loops of small bowel in the left lower quadrant at  the upper limits of normal in caliber. If there is concern for obstruction CT is recommended. Electronically Signed   By: Minerva Fester M.D.   On: 04/21/2023 00:16    Procedures Procedures    Medications Ordered in ED Medications  acetaminophen (TYLENOL) tablet 650 mg (650 mg Oral Given 04/21/23 0007)    ED Course/ Medical Decision Making/ A&P Clinical Course as of 04/21/23 0036  Tue Apr 20, 2023  2329 WBC(!): 12.7 leukocytosis [DW]  2329 Glucose(!): 174 Mild hyperglycemia [DW]  Wed Apr 21, 2023  0035 Patient reports he has had abdominal pain for several years, however this episode worsened over the past several days he admits his biggest concern was his lack of appetite and feeling out of breath at times.  He is in no acute distress.  There is no vomiting, no focal abdominal tenderness.  He has been having normal bowel movements.  Suspicion for acute bowel obstruction or other acute abdominal emergency is low.  He has had multiple CT scans previously. [DW]  0036 For his shortness of breath, he has no hypoxia, no distress, no  tachycardia.  EKG is unchanged.  Chest x-ray is clear.  He already has gastroenterology follow-up later this week. [DW]    Clinical Course User Index [DW] Zadie Rhine, MD                             Medical Decision Making Amount and/or Complexity of Data Reviewed Labs: ordered. Decision-making details documented in ED Course. Radiology: ordered. ECG/medicine tests: ordered.  Risk OTC drugs.   This patient presents to the ED for concern of abdominal pain, this involves an extensive number of treatment options, and is a complaint that carries with it a high risk of complications and morbidity.  The differential diagnosis includes but is not limited to cholecystitis, cholelithiasis, pancreatitis, gastritis, peptic ulcer disease, appendicitis, bowel obstruction, bowel perforation, diverticulitis, AAA, ischemic bowel    Comorbidities that complicate the patient evaluation: Patient's presentation is complicated by their history of gastroparesis  Additional history obtained: Records reviewed Care Everywhere/External Records  Lab Tests: I Ordered, and personally interpreted labs.  The pertinent results include: Mild leukocytosis  Imaging Studies ordered: I ordered imaging studies including X-ray acute abdominal series   I independently visualized and interpreted imaging which showed chest is clear, no signs of obstruction or perforation I agree with the radiologist interpretation   Medicines ordered and prescription drug management: I ordered medication including Tylenol for pain Reevaluation of the patient after these medicines showed that the patient    stayed the same  Reevaluation: After the interventions noted above, I reevaluated the patient and found that they have :stayed the same  Complexity of problems addressed: Patient's presentation is most consistent with  acute presentation with potential threat to life or bodily function  Disposition: After consideration  of the diagnostic results and the patient's response to treatment,  I feel that the patent would benefit from discharge   .           Final Clinical Impression(s) / ED Diagnoses Final diagnoses:  Generalized abdominal pain  Shortness of breath    Rx / DC Orders ED Discharge Orders     None         Zadie Rhine, MD 04/21/23 [id number]

## 2023-04-22 DIAGNOSIS — R1030 Lower abdominal pain, unspecified: Secondary | ICD-10-CM | POA: Diagnosis not present

## 2023-04-22 DIAGNOSIS — R1013 Epigastric pain: Secondary | ICD-10-CM | POA: Diagnosis not present

## 2023-04-22 DIAGNOSIS — K3184 Gastroparesis: Secondary | ICD-10-CM | POA: Diagnosis not present

## 2023-04-24 DIAGNOSIS — R1013 Epigastric pain: Secondary | ICD-10-CM | POA: Diagnosis not present

## 2023-04-24 DIAGNOSIS — K3189 Other diseases of stomach and duodenum: Secondary | ICD-10-CM | POA: Diagnosis not present

## 2023-04-24 DIAGNOSIS — K2289 Other specified disease of esophagus: Secondary | ICD-10-CM | POA: Diagnosis not present

## 2023-04-24 DIAGNOSIS — K317 Polyp of stomach and duodenum: Secondary | ICD-10-CM | POA: Diagnosis not present

## 2023-04-24 DIAGNOSIS — K2270 Barrett's esophagus without dysplasia: Secondary | ICD-10-CM | POA: Diagnosis not present

## 2023-04-24 DIAGNOSIS — R110 Nausea: Secondary | ICD-10-CM | POA: Diagnosis not present

## 2023-05-03 DIAGNOSIS — F341 Dysthymic disorder: Secondary | ICD-10-CM | POA: Diagnosis not present

## 2023-05-03 DIAGNOSIS — F5101 Primary insomnia: Secondary | ICD-10-CM | POA: Diagnosis not present

## 2023-05-03 DIAGNOSIS — F410 Panic disorder [episodic paroxysmal anxiety] without agoraphobia: Secondary | ICD-10-CM | POA: Diagnosis not present

## 2023-05-05 DIAGNOSIS — N281 Cyst of kidney, acquired: Secondary | ICD-10-CM | POA: Diagnosis not present

## 2023-05-05 DIAGNOSIS — R195 Other fecal abnormalities: Secondary | ICD-10-CM | POA: Diagnosis not present

## 2023-05-26 DIAGNOSIS — Z885 Allergy status to narcotic agent status: Secondary | ICD-10-CM | POA: Diagnosis not present

## 2023-05-26 DIAGNOSIS — E785 Hyperlipidemia, unspecified: Secondary | ICD-10-CM | POA: Diagnosis not present

## 2023-05-26 DIAGNOSIS — Z888 Allergy status to other drugs, medicaments and biological substances status: Secondary | ICD-10-CM | POA: Diagnosis not present

## 2023-05-26 DIAGNOSIS — F410 Panic disorder [episodic paroxysmal anxiety] without agoraphobia: Secondary | ICD-10-CM | POA: Diagnosis not present

## 2023-05-26 DIAGNOSIS — E669 Obesity, unspecified: Secondary | ICD-10-CM | POA: Diagnosis not present

## 2023-05-26 DIAGNOSIS — K3184 Gastroparesis: Secondary | ICD-10-CM | POA: Diagnosis not present

## 2023-05-26 DIAGNOSIS — K3189 Other diseases of stomach and duodenum: Secondary | ICD-10-CM | POA: Diagnosis not present

## 2023-05-26 DIAGNOSIS — K2270 Barrett's esophagus without dysplasia: Secondary | ICD-10-CM | POA: Diagnosis not present

## 2023-05-26 DIAGNOSIS — I10 Essential (primary) hypertension: Secondary | ICD-10-CM | POA: Diagnosis not present

## 2023-05-26 DIAGNOSIS — Z79899 Other long term (current) drug therapy: Secondary | ICD-10-CM | POA: Diagnosis not present

## 2023-05-31 DIAGNOSIS — F410 Panic disorder [episodic paroxysmal anxiety] without agoraphobia: Secondary | ICD-10-CM | POA: Diagnosis not present

## 2023-05-31 DIAGNOSIS — F341 Dysthymic disorder: Secondary | ICD-10-CM | POA: Diagnosis not present

## 2023-05-31 DIAGNOSIS — F5101 Primary insomnia: Secondary | ICD-10-CM | POA: Diagnosis not present

## 2023-06-08 DIAGNOSIS — H04123 Dry eye syndrome of bilateral lacrimal glands: Secondary | ICD-10-CM | POA: Diagnosis not present

## 2023-06-08 DIAGNOSIS — H524 Presbyopia: Secondary | ICD-10-CM | POA: Diagnosis not present

## 2023-06-14 ENCOUNTER — Telehealth: Payer: Self-pay

## 2023-06-14 ENCOUNTER — Other Ambulatory Visit (HOSPITAL_COMMUNITY): Payer: Self-pay

## 2023-06-14 NOTE — Telephone Encounter (Signed)
*  [facility]  Pharmacy Patient Advocate Encounter   Received notification from CoverMyMeds that prior authorization for Ubrelvy 100MG  tablets  is required/requested.   Insurance verification completed.   The patient is insured through [facility] .   Per test claim: PA required; PA submitted to [employer] via CoverMyMeds Key/confirmation #/EOC [id number] Status is pending

## 2023-06-15 NOTE — Telephone Encounter (Signed)
PA approved for the patient 06/14/23-06/13/2024 through [facility]

## 2023-06-15 NOTE — Telephone Encounter (Signed)
Pharmacy Patient Advocate Encounter  Received notification from [facility] that Prior Authorization for Ubrelvy 100MG  tablets has been APPROVED from 06/14/2023 to 06/13/2024   PA #/Case ID/Reference #: PA Case ID #: [id number]

## 2023-06-29 DIAGNOSIS — M5416 Radiculopathy, lumbar region: Secondary | ICD-10-CM | POA: Diagnosis not present

## 2023-06-30 ENCOUNTER — Encounter: Payer: Self-pay | Admitting: Neurology

## 2023-06-30 ENCOUNTER — Ambulatory Visit (INDEPENDENT_AMBULATORY_CARE_PROVIDER_SITE_OTHER): Payer: BC Managed Care – PPO | Admitting: Neurology

## 2023-06-30 VITALS — BP 110/67 | HR 91 | Ht 70.0 in | Wt 250.0 lb

## 2023-06-30 DIAGNOSIS — H53141 Visual discomfort, right eye: Secondary | ICD-10-CM | POA: Diagnosis not present

## 2023-06-30 DIAGNOSIS — M961 Postlaminectomy syndrome, not elsewhere classified: Secondary | ICD-10-CM

## 2023-06-30 DIAGNOSIS — G43809 Other migraine, not intractable, without status migrainosus: Secondary | ICD-10-CM | POA: Insufficient documentation

## 2023-06-30 DIAGNOSIS — M5481 Occipital neuralgia: Secondary | ICD-10-CM | POA: Diagnosis not present

## 2023-06-30 DIAGNOSIS — H5711 Ocular pain, right eye: Secondary | ICD-10-CM | POA: Diagnosis not present

## 2023-06-30 MED ORDER — UBRELVY 100 MG PO TABS: 100.0000 mg | ORAL_TABLET | Freq: Every day | ORAL | 1 refills | Status: DC | PRN

## 2023-06-30 NOTE — Patient Instructions (Signed)
Occipital Neuralgia  Occipital neuralgia is a type of headache that causes brief episodes of very bad pain in the back of the head. Pain from occipital neuralgia may spread (radiate) to other parts of the head. These headaches may be caused by irritation of the nerves that leave the spinal cord high up in the neck, just below the base of the skull (occipital nerves). The occipital nerves transmit sensations from the back of the head, the top of the head, and the areas behind the ears. What are the causes? This condition can occur without any known cause (primary headache syndrome). In other cases, this condition is caused by pressure on or irritation of one of the two occipital nerves. Pressure and irritation may be due to: Muscle spasm in the neck. Neck injury. Wear and tear of the vertebrae in the neck (osteoarthritis). Disease of the disks that separate the vertebrae. Swollen blood vessels that put pressure on the occipital nerves. Infections. Tumors. Diabetes. What are the signs or symptoms? This condition causes brief burning, stabbing, electric, shocking, or shooting pain in the back of the head that can radiate to the top of the head. It can happen on one side or both sides of the head. It can also cause: Pain behind the eye. Pain triggered by neck movement or hair brushing. Scalp tenderness. Aching in the back of the head between episodes of very bad pain. Pain that gets worse with exposure to bright lights. How is this diagnosed? Your health care provider may diagnose the condition based on a physical exam and your symptoms. Tests may be done, such as: Imaging studies of the brain and neck (cervical spine), such as an MRI or CT scan. These look for causes of pinched nerves. Applying pressure to the nerves in the neck to try to re-create the pain. Injection of numbing medicine into the occipital nerve areas to see if pain goes away (diagnostic nerve block). How is this  treated? Treatment for this condition may begin with simple measures, such as: Rest. Massage. Applying heat or cold to the area. Over-the-counter pain relievers. If these measures do not work, you may need other treatments, including: Medicines, such as: Prescription-strength anti-inflammatory medicines. Muscle relaxants. Anti-seizure medicines, which can relieve pain. Antidepressants, which can relieve pain. Injected medicines, such as medicines that numb the area (local anesthetic) and steroids. Pulsed radiofrequency ablation. This is when wires are implanted to deliver electrical impulses that block pain signals from the occipital nerve. Surgery to relieve nerve pressure. Physical therapy. Follow these instructions at home: Managing pain     Avoid any activities that cause pain. Rest when you have an attack of pain. Try gentle massage to relieve pain. Try a different pillow or sleeping position. If directed, apply heat to the affected area as often as told by your health care provider. Use the heat source that your health care provider recommends, such as a moist heat pack or a heating pad. Place a towel between your skin and the heat source. Leave the heat on for 20-30 minutes. Remove the heat if your skin turns bright red. This is especially important if you are unable to feel pain, heat, or cold. You have a greater risk of getting burned. If directed, put ice on the back of your head and neck area. To do this: Put ice in a plastic bag. Place a towel between your skin and the bag. Leave the ice on for 20 minutes, 2-3 times  a day. Remove the ice if your skin turns bright red. This is very important. If you cannot feel pain, heat, or cold, you have a greater risk of damage to the area. General instructions Take over-the-counter and prescription medicines only as told by your health care provider. Avoid things that make your symptoms worse, such as bright lights. Try to stay  active. Get regular exercise that does not cause pain. Ask your health care provider to suggest safe exercises for you. Work with a physical therapist to learn stretching exercises you can do at home. Practice good posture. Keep all follow-up visits. This is important. Contact a health care provider if: Your medicine is not working. You have new or worsening symptoms. Get help right away if: You have very bad head pain that does not go away. You have a sudden change in vision, balance, or speech. These symptoms may represent a serious problem that is an emergency. Do not wait to see if the symptoms will go away. Get medical help right away. Call your local emergency services (911 in the [place] Do not drive yourself to the hospital. Summary Occipital neuralgia is a type of headache that causes brief episodes of very bad pain in the back of the head. Pain from occipital neuralgia may spread (radiate) to other parts of the head. Treatment for this condition includes rest, massage, and medicines. This information is not intended to replace advice given to you by your health care provider. Make sure you discuss any questions you have with your health care provider. Document Revised: 07/21/2020 Document Reviewed: 07/21/2020 Elsevier Patient Education  2024 [employer].

## 2023-06-30 NOTE — Progress Notes (Signed)
Provider:  Melvyn Novas, MD  Primary Care Physician:  Tally Joe, MD [phone] [address] [place] [address]     Referring Provider: Tally Joe, Md [address] [place],  [place] [address]          Chief Complaint according to patient   Patient presents with:     New problem - Migraine , triggered from the neck, reaching to the right eye, triggered by atmospheric pressure.            HISTORY OF PRESENT ILLNESS:  [name] is a 52 y.o. male patient who is here for revisit 06/30/2023 for long standing cervicalgia but it has become more migrainous. Started Ubrely po . Migraines last 2 days at times, lost 8 days  or more a month to headaches and migraine. MD in [place] has tried botox for presumed neuralgia. This helped tremendously, while Bernita Raisin  is not reliably working to resolve or prevent .   Had an epidural to L5 and S1.  Migraineur with gastroparesis. Not Diabetic.  The patient believes that the Moderna Covid vaccine had affected him negatively - his GI doctor started on REGLAN (!). Miralax every day, and omeprazol.   He saw Dr. Doreatha Martin for sleep and she wouldn't change meds or try ones.   He now uses Quviv, and Mirtazepine through a psychiatrist who specializes in INSOMNIA. Dr Tonna Corner, MD    Chief concern according to patient :  not here for sleep.       Review of Systems: Out of a complete 14 system review, the patient complains of only the following symptoms, and all other reviewed systems are negative.:  Chronic neck pain, Insomnia, Migraine.   How likely are you to doze in the following situations: 0 = not likely, 1 = slight chance, 2 = moderate chance, 3 = high chance   Sitting and Reading? Watching Television? Sitting inactive in a public place (theater or meeting)? As a passenger in a car for an hour without a break? Lying down in the afternoon when circumstances permit? Sitting and talking to someone? Sitting  quietly after lunch without alcohol? In a car, while stopped for a few minutes in traffic?   Total = 5/ 24 points   FSS endorsed at na/ 63 points.   Anxiety ,   Social History   Socioeconomic History   Marital status: Married    Spouse name: Not on file   Number of children: Not on file   Years of education: Not on file   Highest education level: Not on file  Occupational History   Occupation: Medical illustrator: [employer]    Comment: has an associate degree  Tobacco Use   Smoking status: Never   Smokeless tobacco: Never  Vaping Use   Vaping status: Never Used  Substance and Sexual Activity   Alcohol use: Yes    Comment: occasionally, one glass of wine monthly/ week   Drug use: No   Sexual activity: Not on file  Other Topics Concern   Not on file  Social History Narrative   Not on file   Social Determinants of Health   Financial Resource Strain: Low Risk  (07/02/2022)   Received from [facility], [facility]   Overall Financial Resource Strain (CARDIA)    Difficulty of Paying Living Expenses: Not hard at all  Food Insecurity: No Food Insecurity (07/02/2022)  Received from [facility], [facility]   Hunger Vital Sign    Worried About Running Out of Food in the Last Year: Never true    Ran Out of Food in the Last Year: Never true  Transportation Needs: No Transportation Needs (06/10/2021)   Received from [facility], [facility]   [facility] - Transportation    Lack of Transportation (Medical): No    Lack of Transportation (Non-Medical): No  Physical Activity: Sufficiently Active (07/02/2022)   Received from [facility], [facility]   Exercise Vital Sign    Days of Exercise per Week: 6 days    Minutes of Exercise per Session: 70 min  Stress: Stress Concern Present (07/02/2022)   Received from [facility], [facility] - Occupational Stress Questionnaire    Feeling of Stress : Very much   Social Connections: Socially Integrated (07/02/2022)   Received from [facility], [facility]   Social Network    How would you rate your social network (family, work, friends)?: Good participation with social networks    Family History  Problem Relation Age of Onset   Diabetes Mother    Hypertension Mother    Stroke Mother    Diabetes Father    Migraines Sister        3 sisters    Past Medical History:  Diagnosis Date   Adverse effect of COVID-19 vaccine 05/13/2021   Anxiety    Elevated liver enzymes    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   H/O degenerative disc disease    H/O laminectomy    lumbar, 2009   Herniated disc, cervical    L4,L5   Herniation of cervical intervertebral disc with radiculopathy    High cholesterol    Hypercholesteremia    Hypertension    Hypogonadism, male    Insomnia    PTSD   Low testosterone    Migraine    once monthly   Spinal cord stimulator status 05/13/2021   Transaminitis 05/13/2021    Past Surgical History:  Procedure Laterality Date   BACK SURGERY     diskectomy   CERVICAL DISC ARTHROPLASTY N/A 02/24/2017   Procedure: CERVICAL ANTERIOR DISC ARTHROPLASTY [name];  Surgeon: Venita Lick, MD;  Location: [facility] OR;  Service: Orthopedics;  Laterality: N/A;  3 hrs   LAMINECTOMY  2009   LUMBAR DISC SURGERY  2009   SEPTOPLASTY       Current Outpatient Medications on File Prior to Visit  Medication Sig Dispense Refill   amitriptyline (ELAVIL) 10 MG tablet Take 2 tablets (20 mg total) by mouth at bedtime. 60 tablet 5   amLODipine (NORVASC) 10 MG tablet Take 10 mg by mouth at bedtime.      diazepam (VALIUM) 10 MG tablet TAKE 1/2 TABLET IN MORNING AND 1 AND 1/2 TABLETS AT BEDTIME 60 tablet 5   gabapentin (NEURONTIN) 400 MG capsule Take 400 mg by mouth at bedtime.     hydrochlorothiazide (HYDRODIURIL) 25 MG tablet Take 25 mg by mouth daily.     metoCLOPramide (REGLAN) 5 MG tablet Take 5 mg by mouth 4 (four) times daily. Takes 1 tablet 15 min before  meals and 1 before bedtime     mirtazapine (REMERON) 30 MG tablet Take 30 mg by mouth at bedtime.     QUVIVIQ 50 MG TABS Take 1 tablet by mouth daily.     rosuvastatin (CRESTOR) 5 MG tablet Take 5 mg by mouth daily with supper.  testosterone cypionate (DEPOTESTOTERONE CYPIONATE) 100 MG/ML injection Inject 100 mg into the muscle every 7 (seven) days. Takes on Fridays. For IM use only.     No current facility-administered medications on file prior to visit.    Allergies  Allergen Reactions   Losartan Shortness Of Breath and Palpitations   Alprazolam Other (See Comments)   Hydrocodone-Acetaminophen Other (See Comments)   Tiagabine     Other reaction(s): Other   Covid-19 Mrna Vacc (Moderna) Other (See Comments)   Percocet [Oxycodone-Acetaminophen] Hives     DIAGNOSTIC DATA (LABS, IMAGING, TESTING) - I reviewed patient records, labs, notes, testing and imaging myself where available.  Lab Results  Component Value Date   WBC 12.7 (H) 04/20/2023   HGB 14.8 04/20/2023   HCT 47.1 04/20/2023   MCV 88.5 04/20/2023   PLT 238 04/20/2023      Component Value Date/Time   NA 138 04/20/2023 1907   K 3.4 (L) 04/20/2023 1907   CL 104 04/20/2023 1907   CO2 23 04/20/2023 1907   GLUCOSE 174 (H) 04/20/2023 1907   BUN 13 04/20/2023 1907   CREATININE 1.18 04/20/2023 1907   CALCIUM 8.7 (L) 04/20/2023 1907   PROT 6.6 04/20/2023 1907   ALBUMIN 4.0 04/20/2023 1907   AST 72 (H) 04/20/2023 1907   ALT 73 (H) 04/20/2023 1907   ALKPHOS 92 04/20/2023 1907   BILITOT 0.5 04/20/2023 1907   GFRNONAA >60 04/20/2023 1907   GFRAA >60 04/16/2020 1122   Lab Results  Component Value Date   CHOL 231 (H) 09/21/2016   HDL 36 (L) 09/21/2016   LDLCALC 164 (H) 09/21/2016   TRIG 156 (H) 09/21/2016   CHOLHDL 6.4 (H) 09/21/2016   No results found for: "HGBA1C" No results found for: "VITAMINB12" No results found for: "TSH"  PHYSICAL EXAM:  Today's Vitals   06/30/23 0913  BP: 110/67  Pulse: 91   Weight: 250 lb (113.4 kg)  Height: 5\' 10"  (1.778 m)   Body mass index is 35.87 kg/m.   Wt Readings from Last 3 Encounters:  06/30/23 250 lb (113.4 kg)  04/20/23 250 lb (113.4 kg)  01/15/23 218 lb 4.1 oz (99 kg)     Ht Readings from Last 3 Encounters:  06/30/23 5\' 10"  (1.778 m)  04/20/23 5\' 10"  (1.778 m)  01/15/23 5\' 10"  (1.778 m)      General: The patient is awake, alert and appears not in acute distress. The patient is well groomed. Head: Normocephalic, atraumatic. Neck is supple. Mallampati 3 plus, tongue buckles on the right. ,  neck circumference:19.75 inches . Nasal airflow  patent.  Retrognathia is not seen.  Dental status: biological  Cardiovascular:  Regular rate and cardiac rhythm by pulse,  without distended neck veins. Respiratory: Lungs are clear to auscultation.  Skin:  Without evidence of ankle edema, or rash. Trunk: The patient's posture is erect.   NEUROLOGIC EXAM: The patient is awake and alert, oriented to place and time.   Memory subjective described as intact.  Attention span & concentration ability appears normal.  Speech is fluent,  without  dysarthria, dysphonia or aphasia.  Mood and affect are appropriate.   Cranial nerves: no loss of smell or taste reported  Pupils are equal and briskly reactive to light. Funduscopic exam deferred.  Extraocular movements in vertical and horizontal planes were intact and without nystagmus.  No Diplopia. Visual fields by finger perimetry are intact. Hearing was intact to soft voice and finger rubbing.   Facial sensation intact to  fine touch. Facial motor strength is symmetric and tongue and uvula move midline.  Neck ROM : rotation, tilt and flexion extension were t restricted ( artificial disc in cervical spine ) shoulder shrug was symmetrical.    Motor exam:  Symmetric bulk, tone and ROM.   Normal tone without cog wheeling, symmetric grip strength .   Sensory:  Fine touch, pinprick and vibration were tested  and   normal.  Proprioception tested in the upper extremities was normal.   Coordination: Rapid alternating movements in the fingers/hands were of normal speed.  The Finger-to-nose maneuver was intact without evidence of ataxia, dysmetria or tremor.   Gait and station: Patient could rise unassisted from a seated position, walked without assistive device.  Stance is of normal width/ base and the patient turned with 3 steps.  Toe and heel walk were deferred.  Deep tendon reflexes: in the  upper and lower extremities are symmetrically attenuated - unexpected low response and intact.  Babinski response was deferred.    ASSESSMENT AND PLAN 52 y.o. year old male patient here followed for chronic insomnia in the past, transferred care to Dr Doreatha Martin and Dr Randa Evens:  0) Migraine headaches- no vertigo, no emesis but latent loss of appetite,  radiating to right eye from right occiput. Photophobia. Throbbing temple.  On Bernita Raisin having some months still 4-5 migraines , other month none.     1) Cervicalgia and occipital neuralgia. Responded to botox-  he needs to have a regular botox appointment.  Injection into the post auricular right  occiput.   2) Gastroparesis. Abnormal gastric emptying , on Reglan- could be migraine related.   3) Insomnia not followed here any longer.     I plan to follow up either personally or through our NP within 12 months.   I would like to thank Tally Joe, MD for allowing me to meet with and to take care of this pleasant patient.   CC: I will share my notes with PCP. I request records form Dr Cherrie Distance  After spending a total time of  45  minutes face to face and additional time for physical and neurologic examination, review of laboratory studies,  personal review of imaging studies, reports and results of other testing and review of referral information / records as far as provided in visit,   Electronically signed by: Nathan Novas, MD 06/30/2023 9:19 AM  [facility] Board certified by The [employer] of Sleep Medicine and Diplomate of the [employer] of [employer]. Board certified In Neurology through the ABPN, Fellow of the [employer] of Neu[employer].

## 2023-07-01 ENCOUNTER — Other Ambulatory Visit (HOSPITAL_COMMUNITY): Payer: Self-pay

## 2023-07-16 DIAGNOSIS — G43009 Migraine without aura, not intractable, without status migrainosus: Secondary | ICD-10-CM | POA: Diagnosis not present

## 2023-07-16 DIAGNOSIS — M961 Postlaminectomy syndrome, not elsewhere classified: Secondary | ICD-10-CM | POA: Diagnosis not present

## 2023-07-16 DIAGNOSIS — Z79899 Other long term (current) drug therapy: Secondary | ICD-10-CM | POA: Diagnosis not present

## 2023-07-16 DIAGNOSIS — Z5181 Encounter for therapeutic drug level monitoring: Secondary | ICD-10-CM | POA: Diagnosis not present

## 2023-07-23 DIAGNOSIS — G43019 Migraine without aura, intractable, without status migrainosus: Secondary | ICD-10-CM | POA: Diagnosis not present

## 2023-08-30 DIAGNOSIS — F410 Panic disorder [episodic paroxysmal anxiety] without agoraphobia: Secondary | ICD-10-CM | POA: Diagnosis not present

## 2023-08-30 DIAGNOSIS — F5101 Primary insomnia: Secondary | ICD-10-CM | POA: Diagnosis not present

## 2023-08-30 DIAGNOSIS — F341 Dysthymic disorder: Secondary | ICD-10-CM | POA: Diagnosis not present

## 2023-10-13 ENCOUNTER — Encounter: Payer: Self-pay | Admitting: Neurology

## 2023-11-29 DIAGNOSIS — F341 Dysthymic disorder: Secondary | ICD-10-CM | POA: Diagnosis not present

## 2023-11-29 DIAGNOSIS — F410 Panic disorder [episodic paroxysmal anxiety] without agoraphobia: Secondary | ICD-10-CM | POA: Diagnosis not present

## 2023-11-29 DIAGNOSIS — F5101 Primary insomnia: Secondary | ICD-10-CM | POA: Diagnosis not present

## 2023-12-03 DIAGNOSIS — G43009 Migraine without aura, not intractable, without status migrainosus: Secondary | ICD-10-CM | POA: Diagnosis not present

## 2023-12-17 ENCOUNTER — Other Ambulatory Visit: Payer: Self-pay | Admitting: Neurology

## 2023-12-18 DIAGNOSIS — J209 Acute bronchitis, unspecified: Secondary | ICD-10-CM | POA: Diagnosis not present

## 2023-12-18 DIAGNOSIS — R0981 Nasal congestion: Secondary | ICD-10-CM | POA: Diagnosis not present

## 2023-12-18 DIAGNOSIS — R519 Headache, unspecified: Secondary | ICD-10-CM | POA: Diagnosis not present

## 2023-12-18 DIAGNOSIS — R197 Diarrhea, unspecified: Secondary | ICD-10-CM | POA: Diagnosis not present

## 2023-12-18 DIAGNOSIS — R070 Pain in throat: Secondary | ICD-10-CM | POA: Diagnosis not present

## 2023-12-22 DIAGNOSIS — R070 Pain in throat: Secondary | ICD-10-CM | POA: Diagnosis not present

## 2023-12-22 DIAGNOSIS — I10 Essential (primary) hypertension: Secondary | ICD-10-CM | POA: Diagnosis not present

## 2023-12-22 DIAGNOSIS — R0982 Postnasal drip: Secondary | ICD-10-CM | POA: Diagnosis not present

## 2023-12-22 DIAGNOSIS — R0981 Nasal congestion: Secondary | ICD-10-CM | POA: Diagnosis not present

## 2023-12-22 DIAGNOSIS — R519 Headache, unspecified: Secondary | ICD-10-CM | POA: Diagnosis not present

## 2023-12-22 DIAGNOSIS — R0602 Shortness of breath: Secondary | ICD-10-CM | POA: Diagnosis not present

## 2023-12-23 DIAGNOSIS — Z125 Encounter for screening for malignant neoplasm of prostate: Secondary | ICD-10-CM | POA: Diagnosis not present

## 2023-12-23 DIAGNOSIS — E291 Testicular hypofunction: Secondary | ICD-10-CM | POA: Diagnosis not present

## 2023-12-30 DIAGNOSIS — N5201 Erectile dysfunction due to arterial insufficiency: Secondary | ICD-10-CM | POA: Diagnosis not present

## 2023-12-30 DIAGNOSIS — E291 Testicular hypofunction: Secondary | ICD-10-CM | POA: Diagnosis not present

## 2024-01-18 DIAGNOSIS — M5481 Occipital neuralgia: Secondary | ICD-10-CM | POA: Diagnosis not present

## 2024-01-18 DIAGNOSIS — M961 Postlaminectomy syndrome, not elsewhere classified: Secondary | ICD-10-CM | POA: Diagnosis not present

## 2024-01-18 DIAGNOSIS — M792 Neuralgia and neuritis, unspecified: Secondary | ICD-10-CM | POA: Diagnosis not present

## 2024-01-18 DIAGNOSIS — G43009 Migraine without aura, not intractable, without status migrainosus: Secondary | ICD-10-CM | POA: Diagnosis not present
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
## 2024-01-27 NOTE — Patient Instructions (Signed)
 Below is our plan:  We will continue to monitor. We will try Nurtec in place of Ubrelvy . Take 1 tablet at onset of migraine. May take 1 dose in 24 hours.   Please make sure you are staying well hydrated. I recommend 50-60 ounces daily. Well balanced diet and regular exercise encouraged. Consistent sleep schedule with 6-8 hours recommended.   Please continue follow up with care team as directed.   Follow up with Dr Albertina Hugger in 6-12 months   You may receive a survey regarding today's visit. I encourage you to leave honest feed back as I do use this information to improve patient care. Thank you for seeing me today!   GENERAL HEADACHE INFORMATION:   Natural supplements: Magnesium Oxide or Magnesium Glycinate 500 mg at bed (up to 800 mg daily) Coenzyme Q10 300 mg in AM Vitamin B2- 200 mg twice a day   Add 1 supplement at a time since even natural supplements can have undesirable side effects. You can sometimes buy supplements cheaper (especially Coenzyme Q10) at www.WebmailGuide.co.za or at [facility].  Migraine with aura: There is increased risk for stroke in women with migraine with aura and a contraindication for the combined contraceptive pill for use by women who have migraine with aura. The risk for women with migraine without aura is lower. However other risk factors like smoking are far more likely to increase stroke risk than migraine. There is a recommendation for no smoking and for the use of OCPs without estrogen such as progestogen only pills particularly for women with migraine with aura.[name] People who have migraine headaches with auras may be 3 times more likely to have a stroke caused by a blood clot, compared to migraine patients who don't see auras. Women who take hormone-replacement therapy may be 30 percent more likely to suffer a clot-based stroke than women not taking medication containing estrogen. Other risk factors like smoking and high blood pressure may be  much more important.     Vitamins and herbs that show potential:   Magnesium: Magnesium (250 mg twice a day or 500 mg at bed) has a relaxant effect on smooth muscles such as blood vessels. Individuals suffering from frequent or daily headache usually have low magnesium levels which can be increase with daily supplementation of 400-750 mg. Three trials found 40-90% average headache reduction  when used as a preventative. Magnesium may help with headaches are aura, the best evidence for magnesium is for migraine with aura is its thought to stop the cortical spreading depression we believe is the pathophysiology of migraine aura.Magnesium also demonstrated the benefit in menstrually related migraine.  Magnesium is part of the messenger system in the serotonin cascade and it is a good muscle relaxant.  It is also useful for constipation which can be a side effect of other medications used to treat migraine. Good sources include nuts, whole grains, and tomatoes. Side Effects: loose stool/diarrhea  Riboflavin (vitamin B 2) 200 mg twice a day. This vitamin assists nerve cells in the production of ATP a principal energy storing molecule.  It is necessary for many chemical reactions in the body.  There have been at least 3 clinical trials of riboflavin using 400 mg per day all of which suggested that migraine frequency can be decreased.  All 3 trials showed significant improvement in over half of migraine sufferers.  The supplement is found in bread, cereal, milk, meat, and poultry.  Most Americans get more riboflavin than the recommended daily allowance, however riboflavin  deficiency is not necessary for the supplements to help prevent headache. Side effects: energizing, green urine   Coenzyme Q10: This is present in almost all cells in the body and is critical component for the conversion of energy.  Recent studies have shown that a nutritional supplement of CoQ10 can reduce the frequency of migraine attacks by improving the energy  production of cells as with riboflavin.  Doses of 150 mg twice a day have been shown to be effective.   Melatonin: Increasing evidence shows correlation between melatonin secretion and headache conditions.  Melatonin supplementation has decreased headache intensity and duration.  It is widely used as a sleep aid.  Sleep is natures way of dealing with migraine.  A dose of 3 mg is recommended to start for headaches including cluster headache. Higher doses up to 15 mg has been reviewed for use in Cluster headache and have been used. The rationale behind using melatonin for cluster is that many theories regarding the cause of Cluster headache center around the disruption of the normal circadian rhythm in the brain.  This helps restore the normal circadian rhythm.   HEADACHE DIET: Foods and beverages which may trigger migraine Note that only 20% of headache patients are food sensitive. You will know if you are food sensitive if you get a headache consistently 20 minutes to 2 hours after eating a certain food. Only cut out a food if it causes headaches, otherwise you might remove foods you enjoy! What matters most for diet is to eat a well balanced healthy diet full of vegetables and low fat protein, and to not miss meals.   Chocolate, other sweets ALL cheeses except cottage and cream cheese Dairy products, yogurt, sour cream, ice cream Liver Meat extracts (Bovril, Marmite, meat tenderizers) Meats or fish which have undergone aging, fermenting, pickling or smoking. These include: Hotdogs,salami,Lox,sausage, mortadellas,smoked salmon, pepperoni, Pickled herring Pods of broad bean (English beans, Chinese pea pods, [place] (fava) beans, lima and navy beans Ripe avocado, ripe banana Yeast extracts or active yeast preparations such as Brewer's or Fleishman's (commercial bakes goods are permitted) Tomato based foods, pizza (lasagna, etc.)   MSG (monosodium glutamate) is disguised as many things; look for  these common aliases: Monopotassium glutamate Autolysed yeast Hydrolysed protein Sodium caseinate "flavorings" "all natural preservatives" Nutrasweet   Avoid all other foods that convincingly provoke headaches.   Resources: The Dizzy Althia Jetty Your Headache Diet, migrainestrong.com  https://zamora-andrews.com/   Caffeine and Migraine For patients that have migraine, caffeine intake more than 3 days per week can lead to dependency and increased migraine frequency. I would recommend cutting back on your caffeine intake as best you can. The recommended amount of caffeine is 200-300 mg daily, although migraine patients may experience dependency at even lower doses. While you may notice an increase in headache temporarily, cutting back will be helpful for headaches in the long run. For more information on caffeine and migraine, visit: https://americanmigrainefoundation.org/resource-library/caffeine-and-migraine/   Headache Prevention Strategies:   1. Maintain a headache diary; learn to identify and avoid triggers.  - This can be a simple note where you log when you had a headache, associated symptoms, and medications used - There are several smartphone apps developed to help track migraines: Migraine Buddy, Migraine Monitor, Curelator N1-Headache App   Common triggers include: Emotional triggers: Emotional/Upset family or friends Emotional/Upset occupation Business reversal/success Anticipation anxiety Crisis-serious Post-crisis periodNew job/position   Physical triggers: Vacation Day Weekend Strenuous Exercise High Altitude Location New Move Menstrual Day Physical Illness  Oversleep/Not enough sleep Weather changes Light: Photophobia or light sesnitivity treatment involves a balance between desensitization and reduction in overly strong input. Use dark polarized glasses outside, but not inside. Avoid bright or fluorescent light, but  do not dim environment to the point that going into a normally lit room hurts. Consider FL-41 tint lenses, which reduce the most irritating wavelengths without blocking too much light.  These can be obtained at axonoptics.com or theraspecs.com Foods: see list above.   2. Limit use of acute treatments (over-the-counter medications, triptans, etc.) to no more than 2 days per week or 10 days per month to prevent medication overuse headache (rebound headache).     3. Follow a regular schedule (including weekends and holidays): Don't skip meals. Eat a balanced diet. 8 hours of sleep nightly. Minimize stress. Exercise 30 minutes per day. Being overweight is associated with a 5 times increased risk of chronic migraine. Keep well hydrated and drink 6-8 glasses of water per day.   4. Initiate non-pharmacologic measures at the earliest onset of your headache. Rest and quiet environment. Relax and reduce stress. Breathe2Relax is a free app that can instruct you on    some simple relaxtion and breathing techniques. Http://Dawnbuse.com is a    free website that provides teaching videos on relaxation.  Also, there are  many apps that   can be downloaded for "mindful" relaxation.  An app called YOGA NIDRA will help walk you through mindfulness. Another app called Calm can be downloaded to give you a structured mindfulness guide with daily reminders and skill development. Headspace for guided meditation Mindfulness Based Stress Reduction Online Course: www.palousemindfulness.com Cold compresses.   5. Don't wait!! Take the maximum allowable dosage of prescribed medication at the first sign of migraine.   6. Compliance:  Take prescribed medication regularly as directed and at the first sign of a migraine.   7. Communicate:  Call your physician when problems arise, especially if your headaches change, increase in frequency/severity, or become associated with neurological symptoms (weakness, numbness, slurred  speech, etc.). Proceed to emergency room if you experience new or worsening symptoms or symptoms do not resolve, if you have new neurologic symptoms or if headache is severe, or for any concerning symptom.   8. Headache/pain management therapies: Consider various complementary methods, including medication, behavioral therapy, psychological counselling, biofeedback, massage therapy, acupuncture, dry needling, and other modalities.  Such measures may reduce the need for medications. Counseling for pain management, where patients learn to function and ignore/minimize their pain, seems to work very well.   9. Recommend changing family's attention and focus away from patient's headaches. Instead, emphasize daily activities. If first question of day is 'How are your headaches/Do you have a headache today?', then patient will constantly think about headaches, thus making them worse. Goal is to re-direct attention away from headaches, toward daily activities and other distractions.   10. Helpful Websites: www.AmericanHeadacheSociety.org PatentHood.ch www.headaches.org TightMarket.nl www.achenet.org

## 2024-02-02 ENCOUNTER — Other Ambulatory Visit (HOSPITAL_COMMUNITY): Payer: Self-pay

## 2024-02-02 ENCOUNTER — Encounter: Payer: Self-pay | Admitting: Family Medicine

## 2024-02-02 ENCOUNTER — Telehealth: Payer: Self-pay

## 2024-02-02 ENCOUNTER — Ambulatory Visit: Payer: Medicare Other | Admitting: Family Medicine

## 2024-02-02 VITALS — BP 142/88 | HR 88 | Ht 70.0 in | Wt 254.0 lb

## 2024-02-02 DIAGNOSIS — M5481 Occipital neuralgia: Secondary | ICD-10-CM

## 2024-02-02 DIAGNOSIS — G4700 Insomnia, unspecified: Secondary | ICD-10-CM

## 2024-02-02 DIAGNOSIS — G43709 Chronic migraine without aura, not intractable, without status migrainosus: Secondary | ICD-10-CM

## 2024-02-02 MED ORDER — NURTEC 75 MG PO TBDP: 75.0000 mg | ORAL_TABLET | Freq: Every day | ORAL | 11 refills | Status: AC | PRN

## 2024-02-02 NOTE — Telephone Encounter (Signed)
 Pharmacy Patient Advocate Encounter   Received notification from CoverMyMeds that prior authorization for Nurtec 75MG  dispersible tablets is required/requested.   Insurance verification completed.   The patient is insured through [facility] .   Per test claim: PA required; PA submitted to above mentioned insurance via CoverMyMeds Key/confirmation #/EOC [id number] Status is pending

## 2024-02-02 NOTE — Telephone Encounter (Signed)
 Pharmacy Patient Advocate Encounter  Received notification from [facility] that Prior Authorization for Nurtec 75MG  dispersible tablets has been APPROVED from 02/02/2024 to 04/26/2024. Ran test claim, Copay is $0. This test claim was processed through [facility]- copay amounts may vary at other pharmacies due to pharmacy/plan contracts, or as the patient moves through the different stages of their insurance plan.   PA #/Case ID/Reference #: PA Case ID #: [id number]

## 2024-02-07 DIAGNOSIS — I10 Essential (primary) hypertension: Secondary | ICD-10-CM | POA: Diagnosis not present

## 2024-02-07 DIAGNOSIS — L309 Dermatitis, unspecified: Secondary | ICD-10-CM | POA: Diagnosis not present

## 2024-02-10 DIAGNOSIS — R1030 Lower abdominal pain, unspecified: Secondary | ICD-10-CM | POA: Diagnosis not present

## 2024-02-10 DIAGNOSIS — K3184 Gastroparesis: Secondary | ICD-10-CM | POA: Diagnosis not present

## 2024-02-21 ENCOUNTER — Other Ambulatory Visit: Payer: Self-pay

## 2024-02-21 ENCOUNTER — Encounter: Payer: Self-pay | Admitting: Internal Medicine

## 2024-02-21 ENCOUNTER — Ambulatory Visit: Admitting: Internal Medicine

## 2024-02-21 VITALS — BP 138/82 | HR 92 | Temp 98.8°F | Resp 19 | Ht 70.0 in | Wt 258.1 lb

## 2024-02-21 DIAGNOSIS — R21 Rash and other nonspecific skin eruption: Secondary | ICD-10-CM

## 2024-02-21 DIAGNOSIS — L739 Follicular disorder, unspecified: Secondary | ICD-10-CM | POA: Diagnosis not present

## 2024-02-21 NOTE — Progress Notes (Signed)
 NEW PATIENT Date of Service/Encounter:  02/21/24 Referring provider: Rae Bugler, MD Primary care provider: Rae Bugler, MD  Subjective:  [name] is a 53 y.o. male presenting today for evaluation of rash. History obtained from: chart review and patient.   Discussed the use of AI scribe software for clinical note transcription with the patient, who gave verbal consent to proceed.  History of Present Illness   [name] is a 53 year old male who presents with recurrent bronchitis and associated skin rash.  He has experienced bronchitis twice, once in March of last year and again in March of this year. The first episode followed his wife's bronchitis, despite efforts to avoid contact, and progressed to pneumonia, which resolved with treatment. The second episode began after exposure to lime and fertilizer, with symptoms worsening over several days. Treatment included an inhaler, two antibiotics, and a steroid pack, which resolved the bronchitis.  Following both bronchitis episodes, he developed a rash characterized by red bumps with white heads on his back. The rash appeared after the bronchitis resolved and was initially thought to be folliculitis. The first occurrence cleared with antibiotics and Hibiclens, but the second left black marks and occasional itching, especially after hot showers. His wife applied witch hazel and Benadryl  cream, providing some relief. The rash was itchy when red but is not currently. He notes the rash only appears after bronchitis episodes.  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 He has always had clear skin until these episodes. He has no known allergies and has not experienced similar rashes before these bronchitis episodes. He is currently using Hibiclens to manage the skin condition.       Chart Review:  Reviewed PCP notes from referral 02/08/24: dermatitis,  "Etiology of his recurrent rash is unclear. Certainly may represent a side effect to some of the antibiotics that he has been prescribed given that he did develop a rash after a course of Augmentin in April 2024 and also apparently developed a rash after his second visit with the urgent care on 12/22/2023 at which time he was also prescribed a course of Augmentin. He does not appear to have an active case of folliculitis at this time. It is reviewed that when I saw him on 02/11/2023, that he appeared to have responded well to a 10-day course of doxycycline to treat his prior episode of folliculitis at that time. He was started on doxycycline initially on 12/18/2023 for his more recent symptoms noted.   I reviewed that it is also possible that his exposure to fertilizer with his outside yard work or some other environmental allergen could also be a contributing variable to the rash that he experienced and/or some of his respiratory symptoms that have occurred both last spring and again this spring. In light of that, we have agreed to proceed with a referral to an allergist at this time to consider further assessment. If the allergist feels that the patient would benefit also from a dermatology visit, I would be happy to assist with a formal referral again to consult with dermatology. I did offer to generate a referral to dermatology today already, but he would rather await the visit with the allergist first before proceeding in that fashion.  He expresses appreciation for the time we have spent together today reviewing records and determining most reasonable next steps in evaluation today. 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 60 tablet 5   amLODipine  (NORVASC ) 10 MG tablet Take 10 mg by mouth at bedtime.      diazepam  (VALIUM ) 10 MG tablet TAKE 1/2 TABLET IN MORNING AND 1 AND 1/2 TABLETS AT BEDTIME (Patient taking differently: Take 10 mg by mouth 2 (two) times daily.) 60 tablet 5   gabapentin  (NEURONTIN ) 400 MG capsule Take 1,800 mg by mouth at bedtime. Take 300 am, 300 evening, 1200 at bedtime     hydrochlorothiazide  (HYDRODIURIL ) 25 MG tablet Take 25 mg by mouth daily.     LORazepam  (ATIVAN ) 1 MG tablet Take 1 mg by mouth as needed.     Rimegepant Sulfate (NURTEC) 75 MG TBDP Take 1 tablet (75 mg total) by mouth daily as needed (take for abortive therapy of migraine, no more than 1 tablet in 24 hours or 10 per month). 8 tablet 11   rosuvastatin  (CRESTOR ) 5 MG tablet Take 5 mg by mouth daily with supper.     testosterone  cypionate (DEPOTESTOTERONE CYPIONATE) 100 MG/ML injection Inject 100 mg into the muscle every 7 (seven) days. Takes on Fridays. For IM use only.     zolpidem  (AMBIEN ) 10 MG tablet Take 10 mg by mouth at bedtime as needed for sleep.     mirtazapine (REMERON) 30 MG tablet Take 30 mg by mouth at bedtime. (Patient not taking: Reported on 02/21/2024)     QUVIVIQ 50 MG TABS Take 1 tablet by mouth daily. (Patient not taking: Reported on 02/21/2024)     No current facility-administered medications for this visit.   Known Allergies:  Allergies  Allergen Reactions   Losartan Shortness Of Breath and Palpitations   Alprazolam  Other (See Comments)   Hydrocodone -Acetaminophen  Other  (See Comments)   Tiagabine     Other reaction(s): Other   Covid-19 Mrna Vacc (Moderna) Other (See Comments)   Percocet [Oxycodone -Acetaminophen ] Hives   Past Surgical History: Past Surgical History:  Procedure Laterality Date   BACK SURGERY     diskectomy   CERVICAL DISC ARTHROPLASTY N/A 02/24/2017   Procedure: CERVICAL ANTERIOR DISC ARTHROPLASTY [name];  Surgeon: Mort Ards, MD;  Location: [facility] OR;  Service: Orthopedics;  Laterality: N/A;  3 hrs   LAMINECTOMY  2009   LUMBAR DISC SURGERY  2009   SEPTOPLASTY     Family History: Family History  Problem Relation Age of Onset   Diabetes Mother    Hypertension Mother    Stroke Mother    Diabetes Father    Allergic rhinitis Sister    Migraines Sister        3 sisters   Social History: Therapist, 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   ROS:  All other systems negative except as noted per HPI.  Objective:  Blood pressure 138/82, pulse 92, temperature 98.8 F (37.1 C), temperature source Temporal, resp. rate 19, height 5\' 10"  (1.778 m), weight 258 lb 1.6 oz (117.1 kg), SpO2 95%. Body mass index is 37.03 kg/m. Physical Exam:  General Appearance:  Alert, cooperative, no distress, appears stated age  Head:  Normocephalic, without obvious abnormality, atraumatic  Eyes:  Conjunctiva clear, EOM's intact  Nose: Nares normal,  Throat: Lips, tongue normal; teeth and gums normal,   Neck: Supple, symmetrical  Lungs:   , Respirations unlabored, no coughing  Heart:  , Appears well perfused  Extremities: No edema  Skin: Hyperpigmented papules scattered on upper and mid back, few small erythematous pustules scattered on back, hair on back shaved  Neurologic: No gross deficits    Diagnostics:  Labs:  Lab Orders  No laboratory test(s) ordered today     Assessment and Plan  Assessment and Plan    Folliculitis Recurrent folliculitis post-bronchitis,  possibly exacerbated by shaving, heat, sweat. Healing stage, no active lesions. Bronchitis suspected to be red herring. Rash does not appear allergic in nature.  - Continue Hibiclens for antiseptic cleansing. - Contact primary care provider for dermatologist referral.      Follow up : as needed It was a pleasure meeting you in clinic today! Thank you for allowing me to participate in your care.  Jonathon Neighbors, MD [facility]   This note in its entirety was forwarded to the Provider who requested this consultation.  Other: none  Thank you for your kind referral. I appreciate the opportunity to take part in Lambert's care. Please do not hesitate to contact me with questions.  Sincerely,  Jonathon Neighbors, MD [facility] 

## 2024-03-09 DIAGNOSIS — R062 Wheezing: Secondary | ICD-10-CM | POA: Diagnosis not present

## 2024-03-09 DIAGNOSIS — R0602 Shortness of breath: Secondary | ICD-10-CM | POA: Diagnosis not present

## 2024-03-09 DIAGNOSIS — J209 Acute bronchitis, unspecified: Secondary | ICD-10-CM | POA: Diagnosis not present

## 2024-03-09 DIAGNOSIS — R051 Acute cough: Secondary | ICD-10-CM | POA: Diagnosis not present

## 2024-03-11 DIAGNOSIS — R0602 Shortness of breath: Secondary | ICD-10-CM | POA: Diagnosis not present

## 2024-03-11 DIAGNOSIS — R051 Acute cough: Secondary | ICD-10-CM | POA: Diagnosis not present

## 2024-03-20 DIAGNOSIS — M792 Neuralgia and neuritis, unspecified: Secondary | ICD-10-CM | POA: Diagnosis not present

## 2024-03-20 DIAGNOSIS — M5481 Occipital neuralgia: Secondary | ICD-10-CM | POA: Diagnosis not present

## 2024-03-20 DIAGNOSIS — M961 Postlaminectomy syndrome, not elsewhere classified: Secondary | ICD-10-CM | POA: Diagnosis not present

## 2024-03-20 DIAGNOSIS — G43009 Migraine without aura, not intractable, without status migrainosus: Secondary | ICD-10-CM | POA: Diagnosis not present

## 2024-03-22 DIAGNOSIS — B029 Zoster without complications: Secondary | ICD-10-CM | POA: Diagnosis not present

## 2024-03-23 DIAGNOSIS — E559 Vitamin D deficiency, unspecified: Secondary | ICD-10-CM | POA: Diagnosis not present

## 2024-03-23 DIAGNOSIS — I10 Essential (primary) hypertension: Secondary | ICD-10-CM | POA: Diagnosis not present

## 2024-03-23 DIAGNOSIS — E782 Mixed hyperlipidemia: Secondary | ICD-10-CM | POA: Diagnosis not present

## 2024-03-23 DIAGNOSIS — Z0000 Encounter for general adult medical examination without abnormal findings: Secondary | ICD-10-CM | POA: Diagnosis not present

## 2024-03-23 DIAGNOSIS — Z8709 Personal history of other diseases of the respiratory system: Secondary | ICD-10-CM | POA: Diagnosis not present

## 2024-03-23 DIAGNOSIS — G43709 Chronic migraine without aura, not intractable, without status migrainosus: Secondary | ICD-10-CM | POA: Diagnosis not present

## 2024-03-23 DIAGNOSIS — B029 Zoster without complications: Secondary | ICD-10-CM | POA: Diagnosis not present

## 2024-03-27 DIAGNOSIS — F410 Panic disorder [episodic paroxysmal anxiety] without agoraphobia: Secondary | ICD-10-CM | POA: Diagnosis not present

## 2024-03-27 DIAGNOSIS — F5101 Primary insomnia: Secondary | ICD-10-CM | POA: Diagnosis not present

## 2024-03-27 DIAGNOSIS — F341 Dysthymic disorder: Secondary | ICD-10-CM | POA: Diagnosis not present

## 2024-04-10 DIAGNOSIS — B0223 Postherpetic polyneuropathy: Secondary | ICD-10-CM | POA: Diagnosis not present

## 2024-04-10 DIAGNOSIS — B029 Zoster without complications: Secondary | ICD-10-CM | POA: Diagnosis not present

## 2024-04-21 DIAGNOSIS — D72819 Decreased white blood cell count, unspecified: Secondary | ICD-10-CM | POA: Diagnosis not present

## 2024-04-25 DIAGNOSIS — M5416 Radiculopathy, lumbar region: Secondary | ICD-10-CM | POA: Diagnosis not present

## 2024-04-27 ENCOUNTER — Other Ambulatory Visit (HOSPITAL_COMMUNITY): Payer: Self-pay

## 2024-04-27 ENCOUNTER — Telehealth: Payer: Self-pay | Admitting: Pharmacist

## 2024-04-27 NOTE — Telephone Encounter (Signed)
 Pharmacy Patient Advocate Encounter   Received notification from CoverMyMeds that prior authorization for Nurtec 75MG  dispersible tablets is required/requested.   Insurance verification completed.   The patient is insured through [employer] .   Per test claim: PA required; PA submitted to above mentioned insurance via CoverMyMeds Key/confirmation #/EOC [id number] Status is pending

## 2024-04-28 NOTE — Telephone Encounter (Signed)
 Pharmacy Patient Advocate Encounter  Received notification from [facility] that Prior Authorization for Nurtec 75MG  dispersible tablets has been APPROVED from 04/27/2024 to 04/27/2025   PA #/Case ID/Reference #: PA Case ID #: [id number]

## 2024-05-08 DIAGNOSIS — L700 Acne vulgaris: Secondary | ICD-10-CM | POA: Diagnosis not present

## 2024-05-08 DIAGNOSIS — L810 Postinflammatory hyperpigmentation: Secondary | ICD-10-CM | POA: Diagnosis not present

## 2024-05-15 DIAGNOSIS — M792 Neuralgia and neuritis, unspecified: Secondary | ICD-10-CM | POA: Diagnosis not present

## 2024-05-15 DIAGNOSIS — M961 Postlaminectomy syndrome, not elsewhere classified: Secondary | ICD-10-CM | POA: Diagnosis not present

## 2024-05-15 DIAGNOSIS — M5481 Occipital neuralgia: Secondary | ICD-10-CM | POA: Diagnosis not present

## 2024-06-02 DIAGNOSIS — K3184 Gastroparesis: Secondary | ICD-10-CM | POA: Diagnosis not present

## 2024-07-10 DIAGNOSIS — M961 Postlaminectomy syndrome, not elsewhere classified: Secondary | ICD-10-CM | POA: Diagnosis not present

## 2024-07-10 DIAGNOSIS — M5481 Occipital neuralgia: Secondary | ICD-10-CM | POA: Diagnosis not present

## 2024-07-10 DIAGNOSIS — M792 Neuralgia and neuritis, unspecified: Secondary | ICD-10-CM | POA: Diagnosis not present

## 2024-07-24 DIAGNOSIS — F5101 Primary insomnia: Secondary | ICD-10-CM | POA: Diagnosis not present

## 2024-07-24 DIAGNOSIS — I10 Essential (primary) hypertension: Secondary | ICD-10-CM | POA: Diagnosis not present

## 2024-07-24 DIAGNOSIS — J020 Streptococcal pharyngitis: Secondary | ICD-10-CM | POA: Diagnosis not present

## 2024-07-24 DIAGNOSIS — F341 Dysthymic disorder: Secondary | ICD-10-CM | POA: Diagnosis not present

## 2024-07-24 DIAGNOSIS — R070 Pain in throat: Secondary | ICD-10-CM | POA: Diagnosis not present

## 2024-07-24 DIAGNOSIS — F410 Panic disorder [episodic paroxysmal anxiety] without agoraphobia: Secondary | ICD-10-CM | POA: Diagnosis not present

## 2024-07-24 DIAGNOSIS — B370 Candidal stomatitis: Secondary | ICD-10-CM | POA: Diagnosis not present

## 2024-07-27 DIAGNOSIS — R051 Acute cough: Secondary | ICD-10-CM | POA: Diagnosis not present

## 2024-07-27 DIAGNOSIS — R0602 Shortness of breath: Secondary | ICD-10-CM | POA: Diagnosis not present

## 2024-07-27 DIAGNOSIS — J209 Acute bronchitis, unspecified: Secondary | ICD-10-CM | POA: Diagnosis not present

## 2024-07-27 DIAGNOSIS — R062 Wheezing: Secondary | ICD-10-CM | POA: Diagnosis not present

## 2024-07-31 DIAGNOSIS — J209 Acute bronchitis, unspecified: Secondary | ICD-10-CM | POA: Diagnosis not present

## 2024-08-14 NOTE — Progress Notes (Incomplete)
 New Patient Pulmonology Office Visit   Subjective:  Patient ID: [name], male    DOB: 06-19-1971  MRN: [id number]  Referred by: Turmel, Caleb P, PA  CC: No chief complaint on file.  Discussed the use of AI scribe software for clinical note transcription with the patient, who gave verbal consent to proceed.  History of Present Illness BRAYN ECKSTEIN is a 53 year old male with recurrent bronchitis who presents with recurrent episodes of bronchitis. He was referred by his primary care physician.  He experiences recurrent bronchitis, with two episodes 2024 and three this year, the latest starting on July 20, 2024. Symptoms begin with a severe sore throat, followed by a cough with phlegm associated with wheezing. Despite treatment with amoxicillin, nasal spray, prednisone, albuterol , and doxycycline, he continues to have nocturnal coughing spells and exertional dyspnea.  He uses albuterol  every four hours during episodes and has been prescribed hydrocodone -containing cough syrup for relief. His sister has similar symptoms. Prednisone has been effective in the past, but this episode is more persistent. Wheezing occurs during episodes but not between them.  He has a history of a severe allergic reaction to the COVID vaccine, resulting in thrush, and is currently using mouthwash for thrush. Denied history of asthma in the past. 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 He has history of spinal stimulator.   ROS as above   Allergies: Losartan, Alprazolam , Hydrocodone -acetaminophen , Tiagabine, Covid-19 mrna vacc (moderna), and Percocet [oxycodone -acetaminophen ]  Current Outpatient Medications:    amitriptyline  (ELAVIL ) 10 MG tablet, Take 2 tablets (20 mg total) by mouth at bedtime., Disp: 60 tablet, Rfl: 5   amLODipine  (NORVASC ) 10 MG tablet, Take 10 mg by mouth at bedtime. , Disp: , Rfl:    diazepam  (VALIUM ) 10 MG tablet, TAKE 1/2 TABLET IN  MORNING AND 1 AND 1/2 TABLETS AT BEDTIME (Patient taking differently: Take 10 mg by mouth 2 (two) times daily.), Disp: 60 tablet, Rfl: 5   gabapentin  (NEURONTIN ) 400 MG capsule, Take 1,800 mg by mouth at bedtime. Take 300 am, 300 evening, 1200 at bedtime, Disp: , Rfl:    hydrochlorothiazide  (HYDRODIURIL ) 25 MG tablet, Take 25 mg by mouth daily., Disp: , Rfl:    LORazepam  (ATIVAN ) 1 MG tablet, Take 1 mg by mouth as needed., Disp: , Rfl:    mirtazapine (REMERON) 30 MG tablet, Take 30 mg by mouth at bedtime. (Patient not taking: Reported on 02/21/2024), Disp: , Rfl:    QUVIVIQ 50 MG TABS, Take 1 tablet by mouth daily. (Patient not taking: Reported on 02/21/2024), Disp: , Rfl:    Rimegepant Sulfate (NURTEC) 75 MG TBDP, Take 1 tablet (75 mg total) by mouth daily as needed (take for abortive therapy of migraine, no more than 1 tablet in 24 hours or 10 per month)., Disp: 8 tablet, Rfl: 11   rosuvastatin  (CRESTOR ) 5 MG tablet, Take 5 mg by mouth daily with supper., Disp: , Rfl:    testosterone  cypionate (DEPOTESTOTERONE CYPIONATE) 100 MG/ML injection, Inject 100 mg into the muscle every 7 (seven) days. Takes on Fridays. 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   H/O degenerative  disc disease    H/O laminectomy    lumbar, 2009   Herniated disc, cervical    L4,L5   Herniation of cervical intervertebral disc with radiculopathy    High cholesterol    Hypercholesteremia    Hypertension    Hypogonadism, male    Insomnia    PTSD   Low testosterone     Migraine    once monthly   Spinal cord stimulator status 05/13/2021   Transaminitis 05/13/2021   Past Surgical History:  Procedure Laterality Date   BACK SURGERY     diskectomy   CERVICAL DISC ARTHROPLASTY  N/A 02/24/2017   Procedure: CERVICAL ANTERIOR DISC ARTHROPLASTY [name];  Surgeon: Burnetta Aures, MD;  Location: [facility] OR;  Service: Orthopedics;  Laterality: N/A;  3 hrs   LAMINECTOMY  2009   LUMBAR DISC SURGERY  2009   SEPTOPLASTY     Family History  Problem Relation Age of Onset   Diabetes Mother    Hypertension Mother    Stroke Mother    Diabetes Father    Allergic rhinitis Sister    Migraines Sister        3 sisters   Social History   Socioeconomic History   Marital status: Married    Spouse name: Not on file   Number of children: Not on file   Years of education: Not on file   Highest education level: Not on file  Occupational History   Occupation: Medical Illustrator: [employer]    Comment: has an associate degree  Tobacco Use   Smoking status: Never    Passive exposure: Never   Smokeless tobacco: Never  Vaping Use   Vaping status: Never Used  Substance and Sexual Activity   Alcohol use: Not Currently    Comment: occasionally, one glass of wine monthly/ week   Drug use: No   Sexual activity: Not on file  Other Topics Concern   Not on file  Social History Narrative   Not on file   Social Drivers of Health   Financial Resource Strain: Low Risk  (01/17/2024)   Received from [facility]   Overall Financial Resource Strain (CARDIA)    Difficulty of Paying Living Expenses: Not very hard  Food Insecurity: No Food Insecurity (01/17/2024)   Received from [facility]   Hunger Vital Sign    Within the past 12 months, you worried that your food would run out before you got the money to buy more.: Never true    Within the past 12 months, the food you bought just didn't last and you didn't have money to get more.: Never true  Transportation Needs: No Transportation Needs (01/17/2024)   Received from [facility] - Transportation    Lack of Transportation (Medical): No    Lack of Transportation (Non-Medical): No  Physical Activity:  Sufficiently Active (01/17/2024)   Received from [facility]   Exercise Vital Sign    On average, how many days per week do you engage in moderate to strenuous exercise (like a brisk walk)?: 5 days    On average, how many minutes do you engage in exercise at this level?: 60 min  Stress: Stress Concern Present (01/17/2024)   Received from [facility] - Occupational Stress Questionnaire    Feeling of Stress : To some extent  Social Connections: Patient Declined (01/17/2024)   Received from [facility]   Social Network  How would you rate your social network (family, work, friends)?: Patient declined  Intimate Partner Violence: Not At Risk (01/17/2024)   Received from [facility]   HITS    Over the last 12 months how often did your partner physically hurt you?: Never    Over the last 12 months how often did your partner insult you or talk down to you?: Never    Over the last 12 months how often did your partner threaten you with physical harm?: Never    Over the last 12 months how often did your partner scream or curse at you?: Never    Denied smoking, drinking alcohol, vaping or using drugs. Works in a visual merchandiser.   Objective:  There were no vitals taken for this visit. Wt Readings from Last 3 Encounters:  08/15/24 255 lb 9.6 oz (115.9 kg)  02/21/24 258 lb 1.6 oz (117.1 kg)  02/02/24 254 lb (115.2 kg)   BMI Readings from Last 3 Encounters:  08/15/24 36.67 kg/m  02/21/24 37.03 kg/m  02/02/24 36.45 kg/m   SpO2 Readings from Last 3 Encounters:  08/15/24 94%  02/21/24 95%  04/21/23 94%    Physical Exam Constitutional:      Appearance: Normal appearance.  HENT:     Head: Normocephalic.     Nose: Nose normal.     Mouth/Throat:     Mouth: Mucous membranes are moist.     Pharynx: Oropharynx is clear.     Comments: No exudates. Mallampati 4 Eyes:     Extraocular Movements: Extraocular movements intact.      Pupils: Pupils are equal, round, and reactive to light.  Cardiovascular:     Rate and Rhythm: Normal rate and regular rhythm.  Pulmonary:     Effort: Pulmonary effort is normal.     Breath sounds: Normal breath sounds.     Comments: No wheezing even with forced expiration Musculoskeletal:        General: Normal range of motion.     Cervical back: Normal range of motion.  Skin:    General: Skin is warm.  Neurological:     General: No focal deficit present.     Mental Status: He is alert and oriented to person, place, and time.    Diagnostic Review:   01/2023: neg    08/15/24 : FENO 22- after 1 week of prednisone taper Assessment & Plan:   Assessment & Plan  Suspected moderate persistent asthma with recurrent acute bronchitis Recurrent bronchitis with cough, mucus, and wheezing, especially in October. Symptoms include nocturnal shortness of breath and wheezing. Partial response to prednisone and antibiotics. His FENO today was 22, higher even after a week of prednisone, which supports the asthma diagnosis.  - Prescribed Advair inhaler, 2 puffs BID with spacer. Rinse mouth after every use. - Continue albuterol  inhaler as rescue, 30 minutes before exercise and PRN. - Provided nebulizer for home use, BID for one week, then PRN. - Ordered chest x-ray > normal - Scheduled pulmonary function test in three months. - Prescribed Tussionex 5 mL BID for 7-10 days. Denied allergy. - Recommended OTC cough syrups PRN. - Scheduled follow-up in six weeks.  Oropharyngeal candidiasis (thrush) Thrush. Previous mouthwash antifungal treatment. No evidence of thrust during this encounter.  -Highly recommend to rinse his mouth after every use of advair to avoid thrush.   Return in about 8 weeks (around 10/10/2024).    Marny Patch, MD Pulmonary and Critical Care Medicine [facility]

## 2024-08-15 ENCOUNTER — Ambulatory Visit (INDEPENDENT_AMBULATORY_CARE_PROVIDER_SITE_OTHER)

## 2024-08-15 ENCOUNTER — Ambulatory Visit

## 2024-08-15 VITALS — BP 142/82 | HR 81 | Temp 97.8°F | Ht 70.0 in | Wt 255.6 lb

## 2024-08-15 DIAGNOSIS — J4540 Moderate persistent asthma, uncomplicated: Secondary | ICD-10-CM | POA: Diagnosis not present

## 2024-08-15 DIAGNOSIS — J40 Bronchitis, not specified as acute or chronic: Secondary | ICD-10-CM | POA: Diagnosis not present

## 2024-08-15 DIAGNOSIS — B370 Candidal stomatitis: Secondary | ICD-10-CM

## 2024-08-15 LAB — NITRIC OXIDE: Nitric Oxide: 22

## 2024-08-15 MED ORDER — AEROCHAMBER MV MISC
0 refills | Status: AC
Start: 2024-08-15 — End: ?

## 2024-08-15 MED ORDER — ALBUTEROL SULFATE (2.5 MG/3ML) 0.083% IN NEBU: 2.5000 mg | INHALATION_SOLUTION | Freq: Four times a day (QID) | RESPIRATORY_TRACT | Status: DC | PRN

## 2024-08-15 MED ORDER — ALBUTEROL SULFATE HFA 108 (90 BASE) MCG/ACT IN AERS: 1.0000 | INHALATION_SPRAY | Freq: Four times a day (QID) | RESPIRATORY_TRACT | 6 refills | Status: AC | PRN

## 2024-08-15 MED ORDER — FLUTICASONE-SALMETEROL 115-21 MCG/ACT IN AERO: 2.0000 | INHALATION_SPRAY | Freq: Two times a day (BID) | RESPIRATORY_TRACT | 12 refills | Status: AC

## 2024-08-15 NOTE — Patient Instructions (Addendum)
 Dear [name];  Based on your recurrent symptoms, I think you probably have asthma, I will start therapy for that:  -Advair 2 puff twice a day, with spacer.  -Albuterol  inhaler as need for shortness of breath every 4-6 hours.  -Nebulizer twice a day as need it for shortness of breath. -Cough syrups as need for cough -Tussionex 5 ml twice a day for 7-10 days.  INHALER INSTRUCTIONS: To use the inhaler you follow these steps: Prime the inhaler according to instructions (which means waste between 1-4 doses before next use).  Follow package insert Shake the inhaler before each use Connect spacer Exhale completely by blowing all the air out of your lungs Seal your mouth around the spacer Press down on the canister then inhale SLOW and STEADY until your fill your lungs completely. Hold the breath for 6-10 seconds Gently exhale Wait 60 seconds then repeat steps 2-8. Rinse the mouth after each use to prevent thrush  Your pharmacist can also review proper technique if you have any remaining questions. Let me know if the cost is too high, your insurance may be able to recommend a more affordable option for you.

## 2024-08-16 ENCOUNTER — Telehealth: Payer: Self-pay

## 2024-08-16 ENCOUNTER — Ambulatory Visit: Payer: Self-pay

## 2024-08-16 MED ORDER — HYDROCOD POLI-CHLORPHE POLI ER 10-8 MG/5ML PO SUER: 5.0000 mL | Freq: Two times a day (BID) | ORAL | 0 refills | Status: AC

## 2024-08-16 NOTE — Telephone Encounter (Signed)
 Looks as though the plan was for Tussionex but I can't see where it was sent. Please advise

## 2024-08-16 NOTE — Telephone Encounter (Signed)
 Copied from CRM [id number]. Topic: Clinical - Prescription Issue >> Aug 16, 2024  2:20 PM Devaughn RAMAN wrote: Reason for CRM: Pt called to follow up in regards to cough syrup prescription. Pt stated all his other medications were at the pharmacy with the exception of the cough syrup. Please f/u with pt.  Called and spoke to pt.  Pt states his prescription for the cough syrup discussed at office visit on 08/15/24 was not called in. Please advise, thank you!

## 2024-08-22 MED ORDER — ALBUTEROL SULFATE (2.5 MG/3ML) 0.083% IN NEBU: 2.5000 mg | INHALATION_SOLUTION | Freq: Four times a day (QID) | RESPIRATORY_TRACT | 6 refills | Status: AC | PRN

## 2024-08-22 NOTE — Addendum Note (Signed)
 Addended by: ROLANDA POWELL SAILOR on: 08/22/2024 09:34 AM   Modules accepted: Orders

## 2024-09-04 DIAGNOSIS — M5481 Occipital neuralgia: Secondary | ICD-10-CM | POA: Diagnosis not present

## 2024-09-04 DIAGNOSIS — M961 Postlaminectomy syndrome, not elsewhere classified: Secondary | ICD-10-CM | POA: Diagnosis not present

## 2024-09-04 DIAGNOSIS — M792 Neuralgia and neuritis, unspecified: Secondary | ICD-10-CM | POA: Diagnosis not present

## 2024-10-02 ENCOUNTER — Telehealth: Payer: Self-pay

## 2024-10-02 MED ORDER — BUDESONIDE-FORMOTEROL FUMARATE 160-4.5 MCG/ACT IN AERO: 2.0000 | INHALATION_SPRAY | Freq: Two times a day (BID) | RESPIRATORY_TRACT | 3 refills | Status: AC

## 2024-10-02 MED ORDER — BUDESONIDE-FORMOTEROL FUMARATE 160-4.5 MCG/ACT IN AERO: 2.0000 | INHALATION_SPRAY | Freq: Two times a day (BID) | RESPIRATORY_TRACT | 3 refills | Status: DC

## 2024-10-02 NOTE — Telephone Encounter (Signed)
 I called patient [name], he reports he is feeling better but the inhaler causes some tickle sensation in his throat, and sore. He is using the spacer as instructed. His cough has improved, he hears some wheezing, he is using the nebulizer and inhaler twice a day. He has cough that wake him up in the middle of the night, and he uses the inhaler for relief, he is requesting Tussionex, however I explained it is a restrictive drug given that it has opioids and it can cause addiction problems in the long term. I mentioned that I only prescribe for severe cough. He understands, and all his questions were answered. I sent Symbicort  to his pharmacy 2 puffs twice a day.  Marny Patch, MD

## 2024-10-16 ENCOUNTER — Ambulatory Visit

## 2024-10-16 ENCOUNTER — Ambulatory Visit: Admitting: *Deleted

## 2024-10-16 VITALS — BP 152/87 | HR 96 | Temp 98.2°F | Ht 70.0 in | Wt 269.8 lb

## 2024-10-16 DIAGNOSIS — R053 Chronic cough: Secondary | ICD-10-CM

## 2024-10-16 DIAGNOSIS — J4540 Moderate persistent asthma, uncomplicated: Secondary | ICD-10-CM

## 2024-10-16 DIAGNOSIS — J40 Bronchitis, not specified as acute or chronic: Secondary | ICD-10-CM

## 2024-10-16 DIAGNOSIS — J383 Other diseases of vocal cords: Secondary | ICD-10-CM

## 2024-10-16 LAB — PULMONARY FUNCTION TEST
DL/VA % pred: 126 %
DL/VA: 5.47 ml/min/mmHg/L
DLCO cor % pred: 95 %
DLCO cor: 27.02 ml/min/mmHg
DLCO unc % pred: 95 %
DLCO unc: 27.02 ml/min/mmHg
FEF 25-75 Post: 2.27 L/s
FEF 25-75 Pre: 3.08 L/s
FEF2575-%Change-Post: -26 %
FEF2575-%Pred-Post: 71 %
FEF2575-%Pred-Pre: 96 %
FEV1-%Change-Post: -6 %
FEV1-%Pred-Post: 65 %
FEV1-%Pred-Pre: 69 %
FEV1-Post: 2.43 L
FEV1-Pre: 2.61 L
FEV1FVC-%Change-Post: 3 %
FEV1FVC-%Pred-Pre: 109 %
FEV6-%Change-Post: -9 %
FEV6-%Pred-Post: 59 %
FEV6-%Pred-Pre: 66 %
FEV6-Post: 2.81 L
FEV6-Pre: 3.11 L
FEV6FVC-%Pred-Post: 104 %
FEV6FVC-%Pred-Pre: 104 %
FVC-%Change-Post: -9 %
FVC-%Pred-Post: 57 %
FVC-%Pred-Pre: 63 %
FVC-Post: 2.81 L
FVC-Pre: 3.11 L
Post FEV1/FVC ratio: 87 %
Post FEV6/FVC ratio: 100 %
Pre FEV1/FVC ratio: 84 %
Pre FEV6/FVC Ratio: 100 %
RV % pred: 82 %
RV: 1.78 L
TLC % pred: 82 %
TLC: 5.77 L

## 2024-10-16 NOTE — Patient Instructions (Signed)
 Full PFT performed today.

## 2024-10-16 NOTE — Progress Notes (Signed)
 Full PFT performed today.

## 2024-10-16 NOTE — Progress Notes (Signed)
 "  New Patient Pulmonology Office Visit   Subjective:  Patient ID: [name], male    DOB: 1971-06-25  MRN: [id number]  Referred by: Seabron Lenis, MD  CC: No chief complaint on file.  Discussed the use of AI scribe software for clinical note transcription with the patient, who gave verbal consent to proceed.  History of Present Illness [name] is a 54 year old male who present with recurrent bronchitis. He experiences recurrent bronchitis, with two episodes 2024 and 5 episodes in 2025, the latest starting on 08/2024. Symptoms begin with a severe sore throat, followed by a cough with phlegm associated with wheezing. Normally, these episodes last for 2 weeks. Despite treatment with amoxicillin , nasal spray, prednisone, albuterol , and doxycycline, he continues to have nocturnal coughing spells and exertional dyspnea. He was using albuterol  every four hours during episodes and has been prescribed hydrocodone -containing cough syrup for relief. Prednisone has been effective in the past, but this episode is more persistent. Wheezing occurs during episodes but not between them. Patient has tried symbicort , advair and nebulizer for the last 2 months without resolution of the cough.  Denied history of asthma in the past. 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 He has history of spinal stimulator.  Interval history Patient has tried 2 months of advair, symbicort , albuterol  nebulizer twice without significant resolution of his symptoms. He continues to have dry cough that waking him up from sleep. He is able to work out and continue his gym routine. No significant dyspnea. Denied acid reflux. Tussionex helped with the cough, however I explained I can prescribed this medication for long term given the opioid.  ROS as above   Allergies: Losartan, Alprazolam , Hydrocodone -acetaminophen , Tiagabine, Covid-19 mrna vacc (moderna), and Percocet  [oxycodone -acetaminophen ]  Current Outpatient Medications:    albuterol  (PROVENTIL ) (2.5 MG/3ML) 0.083% nebulizer solution, Take 3 mLs (2.5 mg total) by nebulization every 6 (six) hours as needed for wheezing or shortness of breath., Disp: 75 mL, Rfl: 6   albuterol  (VENTOLIN  HFA) 108 (90 Base) MCG/ACT inhaler, Inhale 1-2 puffs into the lungs every 6 (six) hours as needed for wheezing or shortness of breath., Disp: 1 each, Rfl: 6   amitriptyline  (ELAVIL ) 10 MG tablet, Take 2 tablets (20 mg total) by mouth at bedtime., Disp: 60 tablet, Rfl: 5   amLODipine  (NORVASC ) 10 MG tablet, Take 10 mg by mouth at bedtime. , Disp: , Rfl:    budesonide -formoterol  (SYMBICORT ) 160-4.5 MCG/ACT inhaler, Inhale 2 puffs into the lungs in the morning and at bedtime., Disp: 1 each, Rfl: 3   diazepam  (VALIUM ) 10 MG tablet, TAKE 1/2 TABLET IN MORNING AND 1 AND 1/2 TABLETS AT BEDTIME (Patient taking differently: Take 10 mg by mouth 2 (two) times daily.), Disp: 60 tablet, Rfl: 5   fluticasone -salmeterol (ADVAIR HFA) 115-21 MCG/ACT inhaler, Inhale 2 puffs into the lungs 2 (two) times daily., Disp: 1 each, Rfl: 12   gabapentin  (NEURONTIN ) 400 MG capsule, Take 1,800 mg by mouth at bedtime. Take 300 am, 300 evening, 1200 at bedtime (Patient taking differently: Take 2,400 mg by mouth at bedtime. Take 300 am, 300 evening, 1200 at bedtime), Disp: , Rfl:    hydrochlorothiazide  (HYDRODIURIL ) 25 MG tablet, Take 25 mg by mouth daily., Disp: , Rfl:    LORazepam  (ATIVAN ) 1 MG tablet, Take 1 mg by mouth as needed., Disp: , Rfl:    mirtazapine (REMERON) 30 MG tablet, Take 30 mg by mouth  at bedtime., Disp: , Rfl:    QUVIVIQ 50 MG TABS, Take 1 tablet by mouth daily. (Patient not taking: Reported on 02/21/2024), Disp: , Rfl:    Rimegepant Sulfate (NURTEC) 75 MG TBDP, Take 1 tablet (75 mg total) by mouth daily as needed (take for abortive therapy of migraine, no more than 1 tablet in 24 hours or 10 per month)., Disp: 8 tablet, Rfl: 11    rosuvastatin  (CRESTOR ) 5 MG tablet, Take 5 mg by mouth daily with supper., Disp: , Rfl:    Spacer/Aero-Holding Chambers (AEROCHAMBER MV) inhaler, Use as instructed, Disp: 1 each, Rfl: 0   testosterone  cypionate (DEPOTESTOTERONE CYPIONATE) 100 MG/ML injection, Inject 100 mg into the muscle every 7 (seven) days. Takes on Fridays. 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   H/O degenerative disc disease    H/O laminectomy    lumbar, 2009   Herniated disc, cervical    L4,L5   Herniation of cervical intervertebral disc with radiculopathy    High cholesterol    Hypercholesteremia    Hypertension    Hypogonadism, male    Insomnia    PTSD   Low testosterone     Migraine    once monthly   Spinal cord stimulator status 05/13/2021   Transaminitis 05/13/2021   Past Surgical History:  Procedure Laterality Date   BACK SURGERY     diskectomy   CERVICAL DISC ARTHROPLASTY N/A 02/24/2017   Procedure: CERVICAL ANTERIOR DISC ARTHROPLASTY [name];  Surgeon: Burnetta Aures, MD;  Location: [facility] OR;  Service: Orthopedics;  Laterality: N/A;  3 hrs   LAMINECTOMY  2009   LUMBAR DISC SURGERY  2009   SEPTOPLASTY     Family History  Problem Relation Age of Onset   Diabetes Mother    Hypertension Mother    Stroke Mother    Diabetes Father    Allergic rhinitis Sister    Migraines Sister        3 sisters   Social History   Socioeconomic History   Marital status: Married    Spouse name: Not on file   Number of children: Not on file   Years of education: Not on file   Highest education level: Not on file  Occupational History   Occupation: Patent Examiner: [employer]    Comment: has an associate degree  Tobacco Use   Smoking status: Never    Passive exposure: Never   Smokeless tobacco: Never  Vaping Use   Vaping status: Never Used  Substance and Sexual Activity   Alcohol use: Not Currently    Comment: occasionally, one glass of wine monthly/ week   Drug use: No   Sexual activity: Not on file  Other Topics Concern   Not on file  Social History Narrative   Not on file   Social Drivers of Health   Tobacco Use: Low Risk (09/04/2024)   Received from [facility]   Patient History    Smoking Tobacco Use: Never    Smokeless Tobacco Use: Never    Passive Exposure: Not on  file  Financial Resource Strain: Low Risk (01/17/2024)   Received from [facility]   Overall Financial Resource Strain (CARDIA)    Difficulty of Paying Living Expenses: Not very hard  Food Insecurity: No Food Insecurity (01/17/2024)   Received from [facility]   Epic    Within the past 12 months, you worried that your food would run out before you got the money to buy more.: Never true    Within the past 12 months, the food you bought just didn't last and you didn't have money to get more.: Never true  Transportation Needs: No Transportation Needs (01/17/2024)   Received from [facility] - Transportation    Lack of Transportation (Medical): No    Lack of Transportation (Non-Medical): No  Physical Activity: Sufficiently Active (01/17/2024)   Received from [facility]   Exercise Vital Sign    On average, how many days per week do you engage in moderate to strenuous exercise (like a brisk walk)?: 5 days    On average, how many minutes do you engage in exercise at this level?: 60 min  Stress: Stress Concern Present (01/17/2024)   Received from [facility] - Occupational Stress Questionnaire    Feeling of Stress : To some extent  Social Connections: Patient Declined (01/17/2024)   Received  from [facility]   Social Network    How would you rate your social network (family, work, friends)?: Patient declined  Intimate Partner Violence: Not At Risk (01/17/2024)   Received from [facility]   HITS    Over the last 12 months how often did your partner physically hurt you?: Never    Over the last 12 months how often did your partner insult you or talk down to you?: Never    Over the last 12 months how often did your partner threaten you with physical harm?: Never    Over the last 12 months how often did your partner scream or curse at you?: Never  Depression (PHQ2-9): Not on file  Alcohol Screen: Not on file  Housing: Low Risk (01/17/2024)   Received from [facility]    In the last 12 months, was there a time when you were not able to pay the mortgage or rent on time?: No    In the past 12 months, how many times have you moved where you were living?: 0    At any time in the past 12 months, were you homeless or living in a shelter (including now)?: No  Utilities: Not At Risk (01/17/2024)   Received from [facility]    Threatened with loss of utilities: No  Health Literacy: Not on file    Denied smoking, drinking alcohol, vaping or using drugs. Works in a visual merchandiser.   Objective:  There were no vitals taken for this visit. Wt Readings from Last 3 Encounters:  08/15/24 255 lb 9.6 oz (115.9 kg)  02/21/24 258 lb 1.6 oz (117.1 kg)  02/02/24 254 lb (115.2 kg)   BMI Readings from Last 3 Encounters:  08/15/24 36.67 kg/m  02/21/24 37.03 kg/m  02/02/24 36.45 kg/m   SpO2 Readings from Last 3 Encounters:  08/15/24 94%  02/21/24 95%  04/21/23 94%    Physical Exam Constitutional:      Appearance: Normal appearance.  HENT:     Head: Normocephalic.     Nose: Nose normal.  Mouth/Throat:     Mouth: Mucous membranes are moist.     Pharynx: Oropharynx is clear.     Comments: No exudates. Mallampati 4 Eyes:     Extraocular  Movements: Extraocular movements intact.     Pupils: Pupils are equal, round, and reactive to light.  Cardiovascular:     Rate and Rhythm: Normal rate and regular rhythm.  Pulmonary:     Effort: Pulmonary effort is normal.     Breath sounds: Normal breath sounds.     Comments: No wheezing, no crackles Musculoskeletal:        General: Normal range of motion.     Cervical back: Normal range of motion.  Skin:    General: Skin is warm.  Neurological:     General: No focal deficit present.     Mental Status: He is alert and oriented to person, place, and time.    Diagnostic Review:   08/15/2024 CXR : normal  08/15/24 : FENO 22- after 1 week of prednisone taper  PFTs 10/16/2024 , no obstruction (although decreased FVC/-3.1 and FEV1/-2.3), no improvement with BD (%change negative 9 and 6%) no restriction, normal DLCO.  Assessment & Plan:   Assessment & Plan Recurrent bronchitis Chronic cough with unclear etiology Patient has  5 episodes of bronchitis last year that normally it resolved after 2 weeks. He has persistent cough since October treated with antibiotic, prednisone, tessalon , tussionex without significant improvement. I have tried 2 month of advair, symbicort  without resolution nor improvement of his symptoms. His PFTs did no show obstruction, and no improvement with BD. Tussionex make the cough better however I explained this can not be a long term medication given that it has opioid.  I think we don't have many options left for his symptoms. I will check a CT scan to rule out other conditions as well I will send to ENT evaluation given his hacking cough, hoarseness, and less concern for asthma, to rule out Vocal cord dysfunction.  Plan - Stop advair, symbicort  and nebulizer, given the lack of effect. If symptoms get worst he would let me know to change for a different inhaler like dulera. -Patient is already on gabapentin . -ENT referral -CT chest scan - Scheduled follow-up  after ENT appt and CT scan.  No follow-ups on file.    Marny Patch, MD Pulmonary and Critical Care Medicine [facility] "

## 2024-10-17 ENCOUNTER — Encounter (INDEPENDENT_AMBULATORY_CARE_PROVIDER_SITE_OTHER): Payer: Self-pay

## 2024-10-17 ENCOUNTER — Ambulatory Visit (INDEPENDENT_AMBULATORY_CARE_PROVIDER_SITE_OTHER)

## 2024-10-17 VITALS — BP 127/84 | HR 83 | Temp 98.0°F | Wt 260.0 lb

## 2024-10-17 DIAGNOSIS — K219 Gastro-esophageal reflux disease without esophagitis: Secondary | ICD-10-CM | POA: Diagnosis not present

## 2024-10-17 DIAGNOSIS — J370 Chronic laryngitis: Secondary | ICD-10-CM

## 2024-10-17 DIAGNOSIS — J302 Other seasonal allergic rhinitis: Secondary | ICD-10-CM

## 2024-10-17 MED ORDER — OMEPRAZOLE 20 MG PO CPDR: 20.0000 mg | DELAYED_RELEASE_CAPSULE | Freq: Every day | ORAL | 1 refills | Status: AC

## 2024-10-17 MED ORDER — AMOXICILLIN-POT CLAVULANATE 875-125 MG PO TABS: 1.0000 | ORAL_TABLET | Freq: Two times a day (BID) | ORAL | 0 refills | Status: AC

## 2024-10-17 MED ORDER — FLUCONAZOLE 100 MG PO TABS: 100.0000 mg | ORAL_TABLET | Freq: Every day | ORAL | 0 refills | Status: AC

## 2024-10-17 NOTE — Progress Notes (Unsigned)
 Dear Dr. Seabron, Here is my assessment for our mutual patient, [name]. Thank you for allowing me the opportunity to care for your patient. Please do not hesitate to contact me should you have any other questions. Sincerely, Dr. Penne Croak  Otolaryngology Clinic Note Referring provider: Dr. Seabron HPI:  Discussed the use of AI scribe software for clinical note transcription with the patient, who gave verbal consent to proceed.  History of Present Illness [name] is a 54 year old male with recurrent bronchitis who presents with persistent hoarseness for otolaryngology evaluation.  Hoarseness and Throat Irritation: - Persistent hoarseness and throat irritation since initiation of inhaled medications in November 2025 - Significant xerostomia despite increased fluid intake to 1.5 gallons daily with electrolytes - No improvement after switching from Advair to Symbicort  two weeks prior to visit - Throat pain and severe odynophagia following streptococcal pharyngitis in late 2025  Cough and Respiratory Symptoms: - Five episodes of bronchitis since October 2025, including one progressing to pneumonia requiring emergency care in March or May 2025 - Dry cough with intermittent wheezing following recent pharyngitis - Nocturnal coughing spells most severe, frequently waking him at 1-2 AM and lasting for hours, resulting in significant sleep disruption - No baseline dyspnea or exercise intolerance except during acute illness - Multiple courses of antibiotics, steroids, and bronchodilators (albuterol  inhalers and nebulizers) without symptomatic relief - Pulmonary function testing normal; advised to discontinue all inhalers and nebulizers - CT scan pending for further evaluation  Gastrointestinal Symptoms: - History of Barrett's esophagus and possible gastroparesis diagnosed by endoscopy in 2022 or 2023 (no biopsy performed) - Omeprazole  prescribed, discontinued after symptom  resolution; keeps available for flare-ups - No heartburn, vomiting, or gastroparesis flare in over 18 months  Sinus and Sleep History: - Sinus surgery in 2014 for chronic sinusitis; no subsequent sinus infections - History of snoring as reported by wife - Prior sleep studies borderline for CPAP therapy - No gasping during sleep - No recent sinus infections  Occupational Exposure: - 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   H/O degenerative disc disease    H/O laminectomy    lumbar, 2009   Herniated disc, cervical    L4,L5   Herniation of cervical intervertebral disc with radiculopathy    High cholesterol    Hypercholesteremia    Hypertension    Hypogonadism, male    Insomnia    PTSD   Low testosterone     Migraine    once monthly   Spinal cord stimulator status 05/13/2021   Transaminitis 05/13/2021     Past Surgical History:  Procedure Laterality Date   BACK SURGERY     diskectomy   CERVICAL DISC ARTHROPLASTY N/A 02/24/2017   Procedure: CERVICAL ANTERIOR DISC ARTHROPLASTY [name];  Surgeon: Burnetta Aures, MD;  Location: [facility] OR;  Service: Orthopedics;  Laterality: N/A;  3 hrs   LAMINECTOMY  2009   LUMBAR DISC SURGERY  2009   SEPTOPLASTY      Family History  Problem Relation Age of Onset   Diabetes Mother    Hypertension Mother    Stroke Mother  Diabetes Father    Allergic rhinitis Sister    Migraines Sister        3 sisters     Social Connections: Patient Declined (01/17/2024)   Received from [facility]   Social Network    How  would you rate your social network (family, work, friends)?: Patient declined     Current Medications[1]   Physical Exam:   BP 127/84 (BP Location: Left Arm, Patient Position: Sitting, Cuff Size: Normal)   Pulse 83   Temp 98 F (36.7 C)   Wt 260 lb (117.9 kg)   SpO2 92%   BMI 37.31 kg/m   The patient was awake, alert, and appropriate. The external ears were inspected, and otoscopy was performed to evaluate the external auditory canals and tympanic membranes. The nasal cavity and septum were examined for mucosal changes, obstruction, or discharge. The oral cavity and oropharynx were inspected for mucosal lesions, infection, or tonsillar hypertrophy. The neck was palpated for lymphadenopathy, thyroid abnormalities, or other masses. Cranial nerve function was grossly intact.  Pertinent Findings: General: Well developed, well nourished. No acute distress. Voice without hoarseness Head/Face: Normocephalic. No sinus tenderness. Facial nerve intact and equal bilaterally. No facial lacerations. Eyes: PERRL, no scleral icterus or conjunctival hemorrhage. EOMI. Ears: No gross deformity. Normal external canal. Tympanic membrane with normal landmarks bilaterally Hearing: Normal speech reception.  Nose: No gross deformity or lesions. No purulent discharge. No turbinate hypertrophy.  Mouth/Oropharynx: Lips without any lesions. Dentition fair, multiple caries. No mucosal lesions within the oropharynx. No tonsillar enlargement, exudate, or lesions. Pharyngeal walls symmetrical. Uvula midline. Tongue midline without lesions. Larynx: See TFL if applicable Nasopharynx: See TFL if applicable Neck: Trachea midline. No masses. No thyromegaly or nodules palpated. No crepitus. Lymphatic: No lymphadenopathy in the neck. Respiratory: No stridor or distress. Room air. Cardiovascular: Regular rate and rhythm. Extremities: No edema or cyanosis. Warm and well-perfused. Skin: No scars or lesions on face or  neck. Neurologic: CN II-XII grossly intact. Moving all extremities without gross abnormality. Other:  Physical Exam HEENT: External ears with minimal cerumen, no significant abnormalities. Nasal airway patent bilaterally with normal nasal passages on endoscopy. Larynx erythematous with increased secretions. Vocal cords erythematous and swollen.   Seprately Identifiable Procedures:  I personally ordered, reviewed and interpreted the following with the patient today  Procedure Note Pre-procedure diagnosis:  sore throat Post-procedure diagnosis: Same Procedure: Transnasal Fiberoptic Laryngoscopy, CPT 31575 - Mod 25 Indication: 29M with night cough and persistent sore throat.  Complications: None apparent EBL: 0 mL  The procedure was undertaken to further evaluate the patient's complaint of sore throat, with mirror exam inadequate for appropriate examination due to gag reflex and poor patient tolerance  Procedure:  Patient was identified as correct patient. Verbal consent was obtained. The nose was sprayed with oxymetazoline and 4% lidocaine . The The flexible laryngoscope was passed through the nose to view the nasal cavity, pharynx (oropharynx, hypopharynx) and larynx.  The larynx was examined at rest and during multiple phonatory tasks. Documentation was obtained and reviewed with patient. The scope was removed. The patient tolerated the procedure well.  Findings: The nasal cavity and nasopharynx did not reveal any masses or lesions, mucosa appeared to be without obvious lesions. The tongue base, pharyngeal walls, piriform sinuses, vallecula, epiglottis and postcricoid region are normal in appearance EXCEPT: erythematous larynx including supraglottis and glottis. Arytenoid edema and erythema. The visualized portion of the subglottis and proximal trachea is widely patent. The vocal folds are mobile bilaterally. There are  no lesions on the free edge of the vocal folds nor elsewhere in the larynx  worrisome for malignancy.    Electronically signed by: Penne Croak, DO 10/20/2024 9:23 PM       Impression & Plans:  Elisha Cooksey is a 54 y.o. male  1. Laryngitis, chronic   2. Laryngopharyngeal reflux (LPR)   3. Seasonal allergies    - Findings and diagnoses discussed in detail with the patient. - Risks, benefits, and alternatives were reviewed. Through shared decision making, the patient elects to proceed with below. Assessment & Plan Chronic laryngitis Persistent hoarseness and dysphonia linked to inhaled corticosteroids. Laryngoscopy showed erythematous, edematous vocal cords with increased secretions, indicating chronic laryngitis. - Discontinued inhaled corticosteroids and bronchodilators. - Scheduled follow-up in six weeks for repeat laryngoscopy and reassessment.  Suspected laryngeal infection (bacterial and/or fungal) Laryngoscopic findings suggest active laryngeal infection, possibly bacterial and/or fungal, exacerbated by inhaled corticosteroid use. - Prescribed amoxicillin  for bacterial infection. - Prescribed antifungal agent for fungal infection. - Instructed on oral hygiene post-inhaler use to prevent recurrence.  Suspected laryngopharyngeal reflux Possible laryngopharyngeal reflux contributing to laryngeal irritation and nocturnal cough. Risk factors include possible Barrett's esophagus and gastroparesis. - Prescribed omeprazole  once daily before breakfast for 2-3 months. - Advised to increase omeprazole  to twice daily if symptoms persist.  - Orders placed: No orders of the defined types were placed in this encounter.  - Medications prescribed/continued/adjusted:  Meds ordered this encounter  Medications   fluconazole  (DIFLUCAN ) 100 MG tablet    Sig: Take 1 tablet (100 mg total) by mouth daily.    Dispense:  14 tablet    Refill:  0   amoxicillin -clavulanate (AUGMENTIN ) 875-125 MG tablet    Sig: Take 1 tablet by mouth 2 (two) times daily.    Dispense:   42 tablet    Refill:  0   omeprazole  (PRILOSEC) 20 MG capsule    Sig: Take 1 capsule (20 mg total) by mouth daily before breakfast.    Dispense:  90 capsule    Refill:  1   - Education materials provided to the patient. - Follow up: 6 weeks. Patient instructed to return sooner or go to the ED if new/worsening symptoms develop.  Thank you for allowing me the opportunity to care for your patient. Please do not hesitate to contact me should you have any other questions.  Sincerely, Penne Croak, DO Otolaryngologist (ENT) [facility] Phone: [phone] Fax: [phone]  10/20/2024, 9:23 PM        [1]  Current Outpatient Medications:    albuterol  (PROVENTIL ) (2.5 MG/3ML) 0.083% nebulizer solution, Take 3 mLs (2.5 mg total) by nebulization every 6 (six) hours as needed for wheezing or shortness of breath., Disp: 75 mL, Rfl: 6   albuterol  (VENTOLIN  HFA) 108 (90 Base) MCG/ACT inhaler, Inhale 1-2 puffs into the lungs every 6 (six) hours as needed for wheezing or shortness of breath., Disp: 1 each, Rfl: 6   amLODipine  (NORVASC ) 10 MG tablet, Take 10 mg by mouth at bedtime. , Disp: , Rfl:    amoxicillin -clavulanate (AUGMENTIN ) 875-125 MG tablet, Take 1 tablet by mouth 2 (two) times daily., Disp: 42 tablet, Rfl: 0   budesonide -formoterol  (SYMBICORT ) 160-4.5 MCG/ACT inhaler, Inhale 2 puffs into the lungs in the morning and at bedtime., Disp: 1 each, Rfl: 3   diazepam  (VALIUM ) 10 MG tablet, TAKE 1/2 TABLET IN MORNING AND 1 AND 1/2 TABLETS AT BEDTIME (Patient taking differently: Take 10 mg by mouth 2 (two) times daily.),  Disp: 60 tablet, Rfl: 5   fluconazole  (DIFLUCAN ) 100 MG tablet, Take 1 tablet (100 mg total) by mouth daily., Disp: 14 tablet, Rfl: 0   gabapentin  (NEURONTIN ) 400 MG capsule, Take 1,800 mg by mouth at bedtime. Take 300 am, 300 evening, 1200 at bedtime (Patient taking differently: Take 2,400 mg by mouth at bedtime. Take 300 am, 300 evening, 1200 at bedtime),  Disp: , Rfl:    hydrochlorothiazide  (HYDRODIURIL ) 25 MG tablet, Take 25 mg by mouth daily., Disp: , Rfl:    mirtazapine (REMERON) 30 MG tablet, Take 30 mg by mouth at bedtime., Disp: , Rfl:    omeprazole  (PRILOSEC) 20 MG capsule, Take 1 capsule (20 mg total) by mouth daily before breakfast., Disp: 90 capsule, Rfl: 1   Rimegepant Sulfate (NURTEC) 75 MG TBDP, Take 1 tablet (75 mg total) by mouth daily as needed (take for abortive therapy of migraine, no more than 1 tablet in 24 hours or 10 per month)., Disp: 8 tablet, Rfl: 11   rosuvastatin  (CRESTOR ) 5 MG tablet, Take 5 mg by mouth daily with supper., Disp: , Rfl:    Spacer/Aero-Holding Chambers (AEROCHAMBER MV) inhaler, Use as instructed, Disp: 1 each, Rfl: 0   testosterone  cypionate (DEPOTESTOTERONE CYPIONATE) 100 MG/ML injection, Inject 100 mg into the muscle every 7 (seven) days. Takes on Fridays. For IM use only., Disp: , Rfl:    traZODone (DESYREL) 50 MG tablet, Take 100 mg by mouth at bedtime as needed., Disp: , Rfl:    amitriptyline  (ELAVIL ) 10 MG tablet, Take 2 tablets (20 mg total) by mouth at bedtime. (Patient not taking: Reported on 10/17/2024), Disp: 60 tablet, Rfl: 5   fluticasone -salmeterol (ADVAIR HFA) 115-21 MCG/ACT inhaler, Inhale 2 puffs into the lungs 2 (two) times daily. (Patient not taking: Reported on 10/17/2024), Disp: 1 each, Rfl: 12   LORazepam  (ATIVAN ) 1 MG tablet, Take 1 mg by mouth as needed., Disp: , Rfl:    QUVIVIQ 50 MG TABS, Take 1 tablet by mouth daily. (Patient not taking: Reported on 02/21/2024), Disp: , Rfl:    zolpidem  (AMBIEN ) 10 MG tablet, Take 10 mg by mouth at bedtime as needed for sleep. (Patient not taking: Reported on 10/17/2024), Disp: , Rfl:

## 2024-10-28 ENCOUNTER — Ambulatory Visit (HOSPITAL_BASED_OUTPATIENT_CLINIC_OR_DEPARTMENT_OTHER): Admission: RE | Admit: 2024-10-28 | Discharge: 2024-10-28 | Disposition: A | Source: Ambulatory Visit

## 2024-10-28 DIAGNOSIS — J4540 Moderate persistent asthma, uncomplicated: Secondary | ICD-10-CM | POA: Diagnosis present

## 2024-10-30 ENCOUNTER — Ambulatory Visit: Payer: Self-pay

## 2024-10-31 ENCOUNTER — Telehealth (INDEPENDENT_AMBULATORY_CARE_PROVIDER_SITE_OTHER): Payer: Self-pay

## 2024-10-31 NOTE — Telephone Encounter (Signed)
 Please advise on Advair use

## 2024-10-31 NOTE — Telephone Encounter (Signed)
 Patient called wanting to leave a message for Dr. Anice. Patient explains that his hoarseness, throat irritation, and dry mouth are still persistent. He has finished the antifungal medication but is still on the amoxicillin . He would like to know if there is anything else that he can try. Please advise.

## 2024-11-01 NOTE — Telephone Encounter (Signed)
 I spoke with patient to inform him that per Dr. Anice, he is to take 2 Omeprazole 's a day, one before breakfast and one before dinner. Patient stated he is taking it that way. I readdressed with Dr. Anice and informed patiet , per Dr. Anice, lets try to take Omeprazole  for a full six weeks to see if it will help. Patient understood.

## 2024-11-28 ENCOUNTER — Ambulatory Visit (INDEPENDENT_AMBULATORY_CARE_PROVIDER_SITE_OTHER)

## 2025-02-01 ENCOUNTER — Ambulatory Visit: Admitting: Neurology
